# Patient Record
Sex: Female | Born: 1975 | Hispanic: Yes | Marital: Single | Smoking: Never smoker
Health system: Southern US, Community
[De-identification: ages and names within clinical notes are randomized; demographics above are authoritative.]

## PROBLEM LIST (undated history)

## (undated) DIAGNOSIS — N390 Urinary tract infection, site not specified: Secondary | ICD-10-CM

## (undated) DIAGNOSIS — R87619 Unspecified abnormal cytological findings in specimens from cervix uteri: Secondary | ICD-10-CM

## (undated) HISTORY — PX: NO PAST SURGERIES: SHX2092

## (undated) HISTORY — PX: CERVICAL BIOPSY  W/ LOOP ELECTRODE EXCISION: SUR135

## (undated) HISTORY — DX: Unspecified abnormal cytological findings in specimens from cervix uteri: R87.619

---

## 2006-05-17 HISTORY — PX: BREAST ENHANCEMENT SURGERY: SHX7

## 2013-06-23 ENCOUNTER — Emergency Department (HOSPITAL_BASED_OUTPATIENT_CLINIC_OR_DEPARTMENT_OTHER)
Admission: EM | Admit: 2013-06-23 | Discharge: 2013-06-23 | Disposition: A | Discharge status: Home / Self Care | Payer: MEDICAID | Attending: Emergency Medicine | Admitting: Emergency Medicine

## 2013-06-23 ENCOUNTER — Encounter (HOSPITAL_BASED_OUTPATIENT_CLINIC_OR_DEPARTMENT_OTHER): Payer: Self-pay | Admitting: Emergency Medicine

## 2013-06-23 DIAGNOSIS — IMO0002 Reserved for concepts with insufficient information to code with codable children: Secondary | ICD-10-CM | POA: Insufficient documentation

## 2013-06-23 DIAGNOSIS — Z3202 Encounter for pregnancy test, result negative: Secondary | ICD-10-CM | POA: Insufficient documentation

## 2013-06-23 DIAGNOSIS — F10121 Alcohol abuse with intoxication delirium: Secondary | ICD-10-CM

## 2013-06-23 DIAGNOSIS — R1110 Vomiting, unspecified: Secondary | ICD-10-CM | POA: Insufficient documentation

## 2013-06-23 DIAGNOSIS — F1010 Alcohol abuse, uncomplicated: Secondary | ICD-10-CM | POA: Insufficient documentation

## 2013-06-23 LAB — URINALYSIS, ROUTINE W REFLEX MICROSCOPIC
Bilirubin Urine: NEGATIVE
Glucose, UA: NEGATIVE mg/dL
HGB URINE DIPSTICK: NEGATIVE
Ketones, ur: NEGATIVE mg/dL
LEUKOCYTES UA: NEGATIVE
Nitrite: NEGATIVE
PROTEIN: NEGATIVE mg/dL
SPECIFIC GRAVITY, URINE: 1.005 (ref 1.005–1.030)
Urobilinogen, UA: 0.2 mg/dL (ref 0.0–1.0)
pH: 6 (ref 5.0–8.0)

## 2013-06-23 LAB — CBC WITH DIFFERENTIAL/PLATELET
BAND NEUTROPHILS: 0 % (ref 0–10)
BASOS ABS: 0 10*3/uL (ref 0.0–0.1)
BASOS PCT: 0 % (ref 0–1)
BLASTS: 0 %
Eosinophils Absolute: 0.8 10*3/uL — ABNORMAL HIGH (ref 0.0–0.7)
Eosinophils Relative: 6 % — ABNORMAL HIGH (ref 0–5)
HCT: 39.6 % (ref 36.0–46.0)
HEMOGLOBIN: 13.5 g/dL (ref 12.0–15.0)
LYMPHS ABS: 5.9 10*3/uL — AB (ref 0.7–4.0)
LYMPHS PCT: 44 % (ref 12–46)
MCH: 28.1 pg (ref 26.0–34.0)
MCHC: 34.1 g/dL (ref 30.0–36.0)
MCV: 82.5 fL (ref 78.0–100.0)
METAMYELOCYTES PCT: 0 %
MYELOCYTES: 0 %
Monocytes Absolute: 1.3 10*3/uL — ABNORMAL HIGH (ref 0.1–1.0)
Monocytes Relative: 10 % (ref 3–12)
Neutro Abs: 5.4 10*3/uL (ref 1.7–7.7)
Neutrophils Relative %: 40 % — ABNORMAL LOW (ref 43–77)
Platelets: 300 10*3/uL (ref 150–400)
Promyelocytes Absolute: 0 %
RBC: 4.8 MIL/uL (ref 3.87–5.11)
RDW: 12.8 % (ref 11.5–15.5)
WBC: 13.4 10*3/uL — ABNORMAL HIGH (ref 4.0–10.5)
nRBC: 0 /100 WBC

## 2013-06-23 LAB — RAPID URINE DRUG SCREEN, HOSP PERFORMED
Amphetamines: NOT DETECTED
BARBITURATES: NOT DETECTED
Benzodiazepines: NOT DETECTED
COCAINE: NOT DETECTED
Opiates: NOT DETECTED
Tetrahydrocannabinol: NOT DETECTED

## 2013-06-23 LAB — PREGNANCY, URINE: Preg Test, Ur: NEGATIVE

## 2013-06-23 LAB — COMPREHENSIVE METABOLIC PANEL
ALK PHOS: 93 U/L (ref 39–117)
ALT: 16 U/L (ref 0–35)
AST: 18 U/L (ref 0–37)
Albumin: 4.5 g/dL (ref 3.5–5.2)
BUN: 9 mg/dL (ref 6–23)
CO2: 16 mEq/L — ABNORMAL LOW (ref 19–32)
Calcium: 9.5 mg/dL (ref 8.4–10.5)
Chloride: 107 mEq/L (ref 96–112)
Creatinine, Ser: 0.6 mg/dL (ref 0.50–1.10)
GFR calc non Af Amer: >90 mL/min (ref >90)
GLUCOSE: 121 mg/dL — AB (ref 70–99)
POTASSIUM: 3.5 meq/L — AB (ref 3.7–5.3)
SODIUM: 144 meq/L (ref 137–147)
Total Bilirubin: <0.2 mg/dL — ABNORMAL LOW (ref 0.3–1.2)
Total Protein: 8.6 g/dL — ABNORMAL HIGH (ref 6.0–8.3)

## 2013-06-23 LAB — ETHANOL: Alcohol, Ethyl (B): 221 mg/dL — ABNORMAL HIGH (ref 0–11)

## 2013-06-23 MED ORDER — SODIUM CHLORIDE 0.9 % IV BOLUS (SEPSIS)
1000.0000 mL | Freq: Once | INTRAVENOUS | Status: AC
  Administered 2013-06-23: 1000 mL via INTRAVENOUS

## 2013-06-23 MED ORDER — PANTOPRAZOLE SODIUM 40 MG IV SOLR
40.0000 mg | Freq: Once | INTRAVENOUS | Status: AC
  Administered 2013-06-23: 40 mg via INTRAVENOUS

## 2013-06-23 MED ORDER — ONDANSETRON HCL 4 MG/2ML IJ SOLN
4.0000 mg | Freq: Once | INTRAMUSCULAR | Status: AC
  Administered 2013-06-23: 4 mg via INTRAVENOUS

## 2013-06-23 MED ORDER — ONDANSETRON 4 MG PO TBDP
ORAL_TABLET | ORAL | Status: AC
  Filled 2013-06-23: qty 1

## 2013-06-23 MED ORDER — ZIPRASIDONE MESYLATE 20 MG IM SOLR
10.0000 mg | Freq: Once | INTRAMUSCULAR | Status: AC
  Administered 2013-06-23: 10 mg via INTRAMUSCULAR

## 2013-06-23 MED ORDER — ONDANSETRON 4 MG PO TBDP
4.0000 mg | ORAL_TABLET | Freq: Once | ORAL | Status: AC
  Administered 2013-06-23: 4 mg via ORAL

## 2013-06-23 NOTE — ED Notes (Signed)
Pt states she had benadryl with wine tonight. Pt states her [place] was [place] at a club by a candle tonight.

## 2013-06-23 NOTE — ED Notes (Signed)
Pt brought in by friend , ETOH , pt flopping around on stretcher, vomiting and moaning

## 2013-06-23 NOTE — ED Provider Notes (Signed)
CSN: [id number]     Arrival date & time 06/23/13  0108 History   None    Chief Complaint  Patient presents with  . Alcohol Intoxication   (Consider location/radiation/quality/duration/timing/severity/associated sxs/prior Treatment) HPI Level 5 Caveat: altered LOC. This is a 38 year old female was brought in by a friend. She reportedly was drinking alcohol earlier and on arrival is very agitated, and coherent, vomiting, moaning and flailing her limbs. The friend states she only had "a couple of glasses of wine". She is not known to have taken any other drugs other than an allergy medication.  History reviewed. No pertinent past medical history. History reviewed. No pertinent past surgical history. History reviewed. No pertinent family history. History  Substance Use Topics  . Smoking status: Never Smoker   . Smokeless tobacco: Not on file  . Alcohol Use: No   OB History   Grav Para Term Preterm Abortions TAB SAB Ect Mult Living                 Review of Systems  Unable to perform ROS   Allergies  Review of patient's allergies indicates no known allergies.  Home Medications  No current outpatient prescriptions on file. BP 105/63  Pulse 86  Temp(Src) 98.1 F (36.7 C) (Oral)  Resp 12  Ht 5\' 5"  (1.651 m)  Wt 140 lb (63.504 kg)  BMI 23.30 kg/m2  SpO2 98%  LMP 06/13/2013  Physical Exam General: Well-developed, well-nourished female; appearance consistent with age of record HENT: normocephalic; atraumatic Eyes: pupils equal, round and reactive to light; extraocular muscles intact Neck: supple Heart: regular rate and rhythm; no murmurs, rubs or gallops Lungs: clear to auscultation bilaterally Abdomen: soft; nondistended; nontender; no masses or hepatosplenomegaly; bowel sounds present Extremities: No deformity; full range of motion; pulses normal Neurologic: Agitated, altered, intermittent response to verbal commands, able to answer some questions during calm periods,  otherwise moaning and thrashing about; motor function intact in all extremities and symmetric; no facial droop Skin: Warm and dry    ED Course  Procedures (including critical care time)  MDM   Nursing notes and vitals signs, including pulse oximetry, reviewed.  Summary of this visit's results, reviewed by myself:  Labs:  Results for orders placed during the hospital encounter of 06/23/13 (from the past 24 hour(s))  ETHANOL     Status: Abnormal   Collection Time    06/23/13  1:10 AM      Result Value Range   Alcohol, Ethyl (B) 221 (*) 0 - 11 mg/dL  COMPREHENSIVE METABOLIC PANEL     Status: Abnormal   Collection Time    06/23/13  1:10 AM      Result Value Range   Sodium 144  137 - 147 mEq/L   Potassium 3.5 (*) 3.7 - 5.3 mEq/L   Chloride 107  96 - 112 mEq/L   CO2 16 (*) 19 - 32 mEq/L   Glucose, Bld 121 (*) 70 - 99 mg/dL   BUN 9  6 - 23 mg/dL   Creatinine, Ser 7.82  0.50 - 1.10 mg/dL   Calcium 9.5  8.4 - 95.6 mg/dL   Total Protein 8.6 (*) 6.0 - 8.3 g/dL   Albumin 4.5  3.5 - 5.2 g/dL   AST 18  0 - 37 U/L   ALT 16  0 - 35 U/L   Alkaline Phosphatase 93  39 - 117 U/L   Total Bilirubin <0.2 (*) 0.3 - 1.2 mg/dL   GFR calc non Af  Amer >90  >90 mL/min   GFR calc Af Amer >90  >90 mL/min  CBC WITH DIFFERENTIAL     Status: Abnormal   Collection Time    06/23/13  1:10 AM      Result Value Range   WBC 13.4 (*) 4.0 - 10.5 K/uL   RBC 4.80  3.87 - 5.11 MIL/uL   Hemoglobin 13.5  12.0 - 15.0 g/dL   HCT 96.039.6  45.436.0 - 09.846.0 %   MCV 82.5  78.0 - 100.0 fL   MCH 28.1  26.0 - 34.0 pg   MCHC 34.1  30.0 - 36.0 g/dL   RDW 11.912.8  14.711.5 - 82.915.5 %   Platelets 300  150 - 400 K/uL   Neutrophils Relative % 40 (*) 43 - 77 %   Lymphocytes Relative 44  12 - 46 %   Monocytes Relative 10  3 - 12 %   Eosinophils Relative 6 (*) 0 - 5 %   Basophils Relative 0  0 - 1 %   Band Neutrophils 0  0 - 10 %   Metamyelocytes Relative 0     Myelocytes 0     Promyelocytes Absolute 0     Blasts 0     nRBC 0  0 /100  WBC   Neutro Abs 5.4  1.7 - 7.7 K/uL   Lymphs Abs 5.9 (*) 0.7 - 4.0 K/uL   Monocytes Absolute 1.3 (*) 0.1 - 1.0 K/uL   Eosinophils Absolute 0.8 (*) 0.0 - 0.7 K/uL   Basophils Absolute 0.0  0.0 - 0.1 K/uL   Smear Review MORPHOLOGY UNREMARKABLE    URINE RAPID DRUG SCREEN (HOSP PERFORMED)     Status: None   Collection Time    06/23/13  1:20 AM      Result Value Range   Opiates NONE DETECTED  NONE DETECTED   Cocaine NONE DETECTED  NONE DETECTED   Benzodiazepines NONE DETECTED  NONE DETECTED   Amphetamines NONE DETECTED  NONE DETECTED   Tetrahydrocannabinol NONE DETECTED  NONE DETECTED   Barbiturates NONE DETECTED  NONE DETECTED  PREGNANCY, URINE     Status: None   Collection Time    06/23/13  1:20 AM      Result Value Range   Preg Test, Ur NEGATIVE  NEGATIVE  URINALYSIS, ROUTINE W REFLEX MICROSCOPIC     Status: Abnormal   Collection Time    06/23/13  1:20 AM      Result Value Range   Color, Urine COLORLESS (*) YELLOW   APPearance CLEAR  CLEAR   Specific Gravity, Urine 1.005  1.005 - 1.030   pH 6.0  5.0 - 8.0   Glucose, UA NEGATIVE  NEGATIVE mg/dL   Hgb urine dipstick NEGATIVE  NEGATIVE   Bilirubin Urine NEGATIVE  NEGATIVE   Ketones, ur NEGATIVE  NEGATIVE mg/dL   Protein, ur NEGATIVE  NEGATIVE mg/dL   Urobilinogen, UA 0.2  0.0 - 1.0 mg/dL   Nitrite NEGATIVE  NEGATIVE   Leukocytes, UA NEGATIVE  NEGATIVE   1:57 AM Patient resting more comfortably after Geodon 10 mg IM.  3:41 AM Patient now awake and able to ambulate. She will be discharged in the company of her friend who appears to be a sober, competent adult     Hanley SeamenJohn L Acelyn Basham, MD 06/23/13 [phone]

## 2013-06-23 NOTE — ED Notes (Signed)
Pt states she was at [employer] - presents with her "friend" [place] -

## 2013-06-23 NOTE — Discharge Instructions (Signed)
Alcohol Intoxication °Alcohol intoxication occurs when the amount of alcohol that a person has consumed impairs his or her ability to mentally and physically function. Alcohol directly impairs the normal chemical activity of the brain. Drinking large amounts of alcohol can lead to changes in mental function and behavior, and it can cause many physical effects that can be harmful.  °Alcohol intoxication can range in severity from mild to very severe. Various factors can affect the level of intoxication that occurs, such as the person's age, gender, weight, frequency of alcohol consumption, and the presence of other medical conditions (such as diabetes, seizures, or heart conditions). Dangerous levels of alcohol intoxication may occur when people drink large amounts of alcohol in a short period (binge drinking). Alcohol can also be especially dangerous when combined with certain prescription medicines or "recreational" drugs. °SIGNS AND SYMPTOMS °Some common signs and symptoms of mild alcohol intoxication include: °· Loss of coordination. °· Changes in mood and behavior. °· Impaired judgment. °· Slurred speech. °As alcohol intoxication progresses to more severe levels, other signs and symptoms will appear. These may include: °· Vomiting. °· Confusion and impaired memory. °· Slowed breathing. °· Seizures. °· Loss of consciousness. °DIAGNOSIS  °Your health care provider will take a medical history and perform a physical exam. You will be asked about the amount and type of alcohol you have consumed. Blood tests will be done to measure the concentration of alcohol in your blood. In many places, your blood alcohol level must be lower than 80 mg/dL (0.08%) to legally drive. However, many dangerous effects of alcohol can occur at much lower levels.  °TREATMENT  °People with alcohol intoxication often do not require treatment. Most of the effects of alcohol intoxication are temporary, and they go away as the alcohol naturally  leaves the body. Your health care provider will monitor your condition until you are stable enough to go home. Fluids are sometimes given through an IV access tube to help prevent dehydration.  °HOME CARE INSTRUCTIONS °· Do not drive after drinking alcohol. °· Stay hydrated. Drink enough water and fluids to keep your urine clear or pale yellow. Avoid caffeine.   °· Only take over-the-counter or prescription medicines as directed by your health care provider.   °SEEK MEDICAL CARE IF:  °· You have persistent vomiting.   °· You do not feel better after a few days. °· You have frequent alcohol intoxication. Your health care provider can help determine if you should see a substance use treatment counselor. °SEEK IMMEDIATE MEDICAL CARE IF:  °· You become shaky or tremble when you try to stop drinking.   °· You shake uncontrollably (seizure).   °· You throw up (vomit) blood. This may be bright red or may look like black coffee grounds.   °· You have blood in your stool. This may be bright red or may appear as a black, tarry, bad smelling stool.   °· You become lightheaded or faint.   °MAKE SURE YOU:  °· Understand these instructions. °· Will watch your condition. °· Will get help right away if you are not doing well or get worse. °Document Released: 02/10/2005 Document Revised: 01/03/2013 Document Reviewed: 10/06/2012 °[employer]® Patient Information ©2014 [employer]. ° °

## 2015-11-15 DIAGNOSIS — N390 Urinary tract infection, site not specified: Secondary | ICD-10-CM

## 2015-11-15 HISTORY — DX: Urinary tract infection, site not specified: N39.0

## 2015-11-19 ENCOUNTER — Encounter: Payer: Self-pay | Admitting: *Deleted

## 2015-11-19 ENCOUNTER — Other Ambulatory Visit: Payer: Medicaid Other

## 2015-11-19 NOTE — Patient Instructions (Signed)
  Your procedure is scheduled on: 11-25-15  Report to [facility] Day Surgery 2nd floor medical mall To find out your arrival time please call [phone] between 1PM - 3PM on 11-24-15  Remember: Instructions that are not followed completely may result in serious medical risk, up to and including death, or upon the discretion of your surgeon and anesthesiologist your surgery may need to be rescheduled.    _x___ 1. Do not eat food or drink liquids after midnight. No gum chewing or hard candies.     __x__ 2. No Alcohol for 24 hours before or after surgery.   __x__3. No Smoking for 24 prior to surgery.   ____  4. Bring all medications with you on the day of surgery if instructed.    __x__ 5. Notify your doctor if there is any change in your medical condition     (cold, fever, infections).     Do not wear jewelry, make-up, hairpins, clips or nail polish.  Do not wear lotions, powders, or perfumes. You may wear deodorant.  Do not shave 48 hours prior to surgery. Men may shave face and neck.  Do not bring valuables to the hospital.    [facility] is not responsible for any belongings or valuables.               Contacts, dentures or bridgework may not be worn into surgery.  Leave your suitcase in the car. After surgery it may be brought to your room.  For patients admitted to the hospital, discharge time is determined by your treatment team.   Patients discharged the day of surgery will not be allowed to drive home.    Please read over the following fact sheets that you were given:   [facility] and or MRSA Information   ____ Take these medicines the morning of surgery with A SIP OF WATER:    1. NONE  2.  3.  4.  5.  6.  ____ Fleet Enema (as directed)   ____ Use CHG Soap or sage wipes as directed on instruction sheet   ____ Use inhalers on the day of surgery and bring to hospital day of surgery  ____ Stop metformin 2 days prior to surgery    ____ Take 1/2 of  usual insulin dose the night before surgery and none on the morning of surgery.   ____ Stop aspirin or coumadin, or plavix  _x__ Stop Anti-inflammatories such as Advil, Aleve, Ibuprofen, Motrin, Naproxen,          Naprosyn, Goodies powders or aspirin products. Ok to take Tylenol.   ____ Stop supplements until after surgery.    ____ Bring C-Pap to the hospital.  

## 2015-11-24 ENCOUNTER — Encounter: Payer: Self-pay | Admitting: *Deleted

## 2015-11-25 ENCOUNTER — Encounter: Payer: Self-pay | Admitting: *Deleted

## 2015-11-25 ENCOUNTER — Ambulatory Visit: Payer: Medicaid Other | Admitting: Anesthesiology

## 2015-11-25 ENCOUNTER — Encounter
Admission: RE | Disposition: A | Discharge status: Home / Self Care | Payer: Self-pay | Source: Ambulatory Visit | Attending: Obstetrics and Gynecology

## 2015-11-25 ENCOUNTER — Ambulatory Visit
Admission: RE | Admit: 2015-11-25 | Discharge: 2015-11-25 | Disposition: A | Discharge status: Home / Self Care | Payer: Medicaid Other | Source: Ambulatory Visit | Attending: Obstetrics and Gynecology | Admitting: Obstetrics and Gynecology

## 2015-11-25 DIAGNOSIS — D069 Carcinoma in situ of cervix, unspecified: Secondary | ICD-10-CM | POA: Diagnosis present

## 2015-11-25 DIAGNOSIS — N72 Inflammatory disease of cervix uteri: Secondary | ICD-10-CM | POA: Insufficient documentation

## 2015-11-25 HISTORY — DX: Urinary tract infection, site not specified: N39.0

## 2015-11-25 HISTORY — PX: CERVICAL CONIZATION W/BX: SHX1330

## 2015-11-25 LAB — COMPREHENSIVE METABOLIC PANEL
ALBUMIN: 4.5 g/dL (ref 3.5–5.0)
ALK PHOS: 90 U/L (ref 38–126)
ALT: 29 U/L (ref 14–54)
AST: 23 U/L (ref 15–41)
Anion gap: 7 (ref 5–15)
BILIRUBIN TOTAL: 0.4 mg/dL (ref 0.3–1.2)
BUN: 9 mg/dL (ref 6–20)
CALCIUM: 9.2 mg/dL (ref 8.9–10.3)
CO2: 25 mmol/L (ref 22–32)
Chloride: 107 mmol/L (ref 101–111)
Creatinine, Ser: 0.63 mg/dL (ref 0.44–1.00)
GFR calc Af Amer: >60 mL/min (ref >60)
GFR calc non Af Amer: >60 mL/min (ref >60)
GLUCOSE: 92 mg/dL (ref 65–99)
Potassium: 3.6 mmol/L (ref 3.5–5.1)
Sodium: 139 mmol/L (ref 135–145)
TOTAL PROTEIN: 7.8 g/dL (ref 6.5–8.1)

## 2015-11-25 LAB — POCT PREGNANCY, URINE: PREG TEST UR: NEGATIVE

## 2015-11-25 LAB — CBC
HEMATOCRIT: 40 % (ref 35.0–47.0)
HEMOGLOBIN: 13.7 g/dL (ref 12.0–16.0)
MCH: 27.9 pg (ref 26.0–34.0)
MCHC: 34.3 g/dL (ref 32.0–36.0)
MCV: 81.3 fL (ref 80.0–100.0)
Platelets: 266 10*3/uL (ref 150–440)
RBC: 4.93 MIL/uL (ref 3.80–5.20)
RDW: 12.7 % (ref 11.5–14.5)
WBC: 6.3 10*3/uL (ref 3.6–11.0)

## 2015-11-25 LAB — TYPE AND SCREEN
ABO/RH(D): O POS
Antibody Screen: NEGATIVE

## 2015-11-25 SURGERY — CONE BIOPSY, CERVIX
Anesthesia: General | Wound class: Clean Contaminated

## 2015-11-25 MED ORDER — FERRIC SUBSULFATE 259 MG/GM EX SOLN
CUTANEOUS | Status: AC
  Filled 2015-11-25: qty 8

## 2015-11-25 MED ORDER — EPHEDRINE SULFATE 50 MG/ML IJ SOLN
INTRAMUSCULAR | Status: DC | PRN
  Administered 2015-11-25: 5 mg via INTRAVENOUS

## 2015-11-25 MED ORDER — DEXAMETHASONE SODIUM PHOSPHATE 10 MG/ML IJ SOLN
INTRAMUSCULAR | Status: DC | PRN
  Administered 2015-11-25: 5 mg via INTRAVENOUS

## 2015-11-25 MED ORDER — FENTANYL CITRATE (PF) 100 MCG/2ML IJ SOLN
INTRAMUSCULAR | Status: DC | PRN
Start: 2015-11-25 — End: 2015-11-25
  Administered 2015-11-25 (×5): 25 ug via INTRAVENOUS

## 2015-11-25 MED ORDER — FAMOTIDINE 20 MG PO TABS
ORAL_TABLET | ORAL | Status: AC
  Administered 2015-11-25: 20 mg via ORAL
  Filled 2015-11-25: qty 1

## 2015-11-25 MED ORDER — IBUPROFEN 600 MG PO TABS: 600.0000 mg | ORAL_TABLET | Freq: Four times a day (QID) | ORAL | Status: DC | PRN

## 2015-11-25 MED ORDER — IODINE STRONG (LUGOL'S) SOLN
Status: DC | PRN
  Administered 2015-11-25: 1 mg via TOPICAL

## 2015-11-25 MED ORDER — FAMOTIDINE 20 MG PO TABS
20.0000 mg | ORAL_TABLET | Freq: Once | ORAL | Status: AC
  Administered 2015-11-25: 20 mg via ORAL

## 2015-11-25 MED ORDER — LACTATED RINGERS IV SOLN: INTRAVENOUS | Status: DC

## 2015-11-25 MED ORDER — ONDANSETRON HCL 4 MG/2ML IJ SOLN
INTRAMUSCULAR | Status: DC | PRN
  Administered 2015-11-25: 4 mg via INTRAVENOUS

## 2015-11-25 MED ORDER — LIDOCAINE HCL (CARDIAC) 20 MG/ML IV SOLN
INTRAVENOUS | Status: DC | PRN
  Administered 2015-11-25: 45 mg via INTRAVENOUS

## 2015-11-25 MED ORDER — LACTATED RINGERS IV SOLN
INTRAVENOUS | Status: DC
  Administered 2015-11-25: 125 mL/h via INTRAVENOUS
  Administered 2015-11-25: 17:00:00 via INTRAVENOUS

## 2015-11-25 MED ORDER — IODINE STRONG (LUGOLS) 5 % PO SOLN
ORAL | Status: AC
  Filled 2015-11-25: qty 1

## 2015-11-25 MED ORDER — ONDANSETRON HCL 4 MG/2ML IJ SOLN: 4.0000 mg | Freq: Once | INTRAMUSCULAR | Status: DC | PRN

## 2015-11-25 MED ORDER — MIDAZOLAM HCL 5 MG/5ML IJ SOLN
INTRAMUSCULAR | Status: DC | PRN
  Administered 2015-11-25: 2 mg via INTRAVENOUS

## 2015-11-25 MED ORDER — LIDOCAINE-EPINEPHRINE 1 %-1:100000 IJ SOLN
INTRAMUSCULAR | Status: DC | PRN
  Administered 2015-11-25: 10 mL

## 2015-11-25 MED ORDER — HYDROCODONE-ACETAMINOPHEN 5-325 MG PO TABS: 1.0000 | ORAL_TABLET | Freq: Four times a day (QID) | ORAL | Status: DC | PRN

## 2015-11-25 MED ORDER — LIDOCAINE-EPINEPHRINE (PF) 1 %-1:200000 IJ SOLN
INTRAMUSCULAR | Status: AC
  Filled 2015-11-25: qty 30

## 2015-11-25 MED ORDER — FERRIC SUBSULFATE 259 MG/GM EX SOLN
CUTANEOUS | Status: DC | PRN
  Administered 2015-11-25: 1

## 2015-11-25 MED ORDER — FENTANYL CITRATE (PF) 100 MCG/2ML IJ SOLN: 25.0000 ug | INTRAMUSCULAR | Status: DC | PRN

## 2015-11-25 MED ORDER — PROPOFOL 10 MG/ML IV BOLUS
INTRAVENOUS | Status: DC | PRN
  Administered 2015-11-25: 150 mg via INTRAVENOUS

## 2015-11-25 SURGICAL SUPPLY — 29 items
APPLICATOR COTTON TIP 6IN STRL (MISCELLANEOUS) ×6 IMPLANT
BLADE SURG SZ11 CARB STEEL (BLADE) ×3 IMPLANT
CATH ROBINSON RED A/P 16FR (CATHETERS) IMPLANT
CNTNR SPEC 2.5X3XGRAD LEK (MISCELLANEOUS) ×1
CONT SPEC 4OZ STER OR WHT (MISCELLANEOUS) ×2
CONTAINER SPEC 2.5X3XGRAD LEK (MISCELLANEOUS) ×1 IMPLANT
COUNTER NEEDLE 20/40 LG (NEEDLE) ×3 IMPLANT
ELECT REM PT RETURN 9FT ADLT (ELECTROSURGICAL) ×3
ELECTRODE REM PT RTRN 9FT ADLT (ELECTROSURGICAL) ×1 IMPLANT
GLOVE BIO SURGEON STRL SZ8 (GLOVE) ×9 IMPLANT
GLOVE INDICATOR 7.0 STRL GRN (GLOVE) ×6 IMPLANT
GOWN STRL REUS W/ TWL LRG LVL3 (GOWN DISPOSABLE) ×2 IMPLANT
GOWN STRL REUS W/ TWL XL LVL3 (GOWN DISPOSABLE) ×1 IMPLANT
GOWN STRL REUS W/TWL LRG LVL3 (GOWN DISPOSABLE) ×4
GOWN STRL REUS W/TWL XL LVL3 (GOWN DISPOSABLE) ×2
HANDLE YANKAUER SUCT BULB TIP (MISCELLANEOUS) ×3 IMPLANT
KIT RM TURNOVER CYSTO AR (KITS) ×3 IMPLANT
NEEDLE SPNL 22GX3.5 QUINCKE BK (NEEDLE) ×3 IMPLANT
NS IRRIG 500ML POUR BTL (IV SOLUTION) ×3 IMPLANT
PACK BASIN MINOR ARMC (MISCELLANEOUS) IMPLANT
PACK DNC HYST (MISCELLANEOUS) ×3 IMPLANT
PAD OB MATERNITY 4.3X12.25 (PERSONAL CARE ITEMS) ×3 IMPLANT
PAD PREP 24X41 OB/GYN DISP (PERSONAL CARE ITEMS) ×3 IMPLANT
PENCIL ELECTRO HAND CTR (MISCELLANEOUS) ×3 IMPLANT
SUT CHROMIC 1-0 (SUTURE) IMPLANT
SUT VIC AB 0 CT1 27 (SUTURE) ×2
SUT VIC AB 0 CT1 27XCR 8 STRN (SUTURE) ×1 IMPLANT
SYR CONTROL 10ML (SYRINGE) ×3 IMPLANT
TRAY PREP VAG/GEN (MISCELLANEOUS) IMPLANT

## 2015-11-25 NOTE — Anesthesia Postprocedure Evaluation (Signed)
Anesthesia Post Note  Patient: Hayley Obrien  Procedure(s) Performed: Procedure(s) (LRB): CONIZATION CERVIX WITH BIOPSY (N/A)  Patient location during evaluation: PACU Anesthesia Type: General Level of consciousness: awake and alert Pain management: pain level controlled Vital Signs Assessment: post-procedure vital signs reviewed and stable Respiratory status: spontaneous breathing, nonlabored ventilation, respiratory function stable and patient connected to nasal cannula oxygen Cardiovascular status: blood pressure returned to baseline and stable Postop Assessment: no signs of nausea or vomiting Anesthetic complications: no    Last Vitals:  Filed Vitals:   11/25/15 1725 11/25/15 1800  BP: 90/57 114/72  Pulse: 61 74  Temp: 36.6 C   Resp: 16 16    Last Pain:  Filed Vitals:   11/25/15 1812  PainSc: 2                  Laniya Friedl S

## 2015-11-25 NOTE — Op Note (Signed)
  Operative Note    Pre-Op Diagnosis: Persistent CIN III  Post-Op Diagnosis: Persistent CIN III  Procedures:  Cold Knife Conization of the Cervix  Primary Surgeon: Thomasene MohairStephen Laquisha Northcraft, MD   EBL: 40 mL   IVF: 800 mL   Specimens: Cervical Cone Biopsy with stitch at 12 o'clock, ECC  Drains: None  Complications: None   Disposition: PACU   Condition: Stable   Procedure Summary:  The patient was taken to the OR where general anesthesia was administered and found to be adequate. She was placed in the dorsal supine, high lithotomy position in candy cane stirrups.  She was prepped and draped in the usual sterile fashion.  After a timeout was called a weighted speculum was placed in the vagina with an anterior right-angle retractor placed.  Stay sutures of 0 vicryl were placed at 3 and 9 o'clock on the cervix.  A paracervical block was placed circumferentially around the cervix using lidocaine 1% with epinephrine.  Lugol's solution was used to assess dysplasia with no Lugol's negative areas noted.  An 11 blade was used to cut the cone specimen at the ectocervical and endocervical margins with monopolar electrocautery used along the cervical stroma.  The specimen was tagged with a suture at 12 o'clock. An ECC was then performed.  Bovie cautery was used to ensure hemostasis at the surgical bed. Sutures were placed along the ectocervical epithelium in a running fashion.  The cervical os was opened and Monsel's solution was placed on the bed. The stay sutures were removed and hemostasis was noted.  The patient tolerated the procedure well.  Sponge, lap, needle, and instrument counts were correct x 2.  VTE prophylaxis: SCDs. Antibiotic prophylaxis: none indicated. She was awakened in the operating room and was taken to the PACU in stable condition.   Thomasene MohairStephen Kelly Ranieri, MD 11/25/2015 4:28 PM

## 2015-11-25 NOTE — Anesthesia Procedure Notes (Signed)
Procedure Name: LMA Insertion Date/Time: 11/25/2015 3:35 PM Performed by: Lily KocherPERALTA, Janely Gullickson Pre-anesthesia Checklist: Patient identified, Patient being monitored, Timeout performed, Emergency Drugs available and Suction available Patient Re-evaluated:Patient Re-evaluated prior to inductionOxygen Delivery Method: Circle system utilized Preoxygenation: Pre-oxygenation with 100% oxygen Intubation Type: IV induction Ventilation: Mask ventilation without difficulty LMA: LMA inserted LMA Size: 4.0 Tube type: Oral Number of attempts: 1 Placement Confirmation: positive ETCO2 and breath sounds checked- equal and bilateral Tube secured with: Tape Dental Injury: Teeth and Oropharynx as per pre-operative assessment

## 2015-11-25 NOTE — H&P (Signed)
History and Physical Interval Note:  Hayley Obrien  has presented today for surgery, with the diagnosis of CIN 3 WITH POSITIVE MARGIN (endocervical). The various methods of treatment have been discussed with the patient and family. After consideration of risks, benefits and other options for treatment, the patient has consented to  Procedure(s): CONIZATION CERVIX WITH BIOPSY (N/A) as a surgical intervention .  The patient's history has been reviewed, patient examined, no change in status, stable for surgery.  I have reviewed the patient's chart and labs.  Questions were answered to the patient's satisfaction.    Conard NovakJackson, Falicia Lizotte D, MD 11/25/2015 3:23 PM

## 2015-11-25 NOTE — Anesthesia Preprocedure Evaluation (Signed)
Anesthesia Evaluation  Patient identified by MRN, date of birth, ID band Patient awake    Reviewed: Allergy & Precautions, NPO status , Patient's Chart, lab work & pertinent test results  Airway Mallampati: II       Dental no notable dental hx.    Pulmonary neg pulmonary ROS,    Pulmonary exam normal        Cardiovascular negative cardio ROS Normal cardiovascular exam     Neuro/Psych negative neurological ROS  negative psych ROS   GI/Hepatic negative GI ROS, Neg liver ROS,   Endo/Other  negative endocrine ROS  Renal/GU negative Renal ROS  Female GU complaint     Musculoskeletal negative musculoskeletal ROS (+)   Abdominal Normal abdominal exam  (+)   Peds negative pediatric ROS (+)  Hematology negative hematology ROS (+)   Anesthesia Other Findings   Reproductive/Obstetrics                             Anesthesia Physical Anesthesia Plan  ASA: I  Anesthesia Plan: General   Post-op Pain Management:    Induction: Intravenous  Airway Management Planned: LMA  Additional Equipment:   Intra-op Plan:   Post-operative Plan: Extubation in OR  Informed Consent: I have reviewed the patients History and Physical, chart, labs and discussed the procedure including the risks, benefits and alternatives for the proposed anesthesia with the patient or authorized representative who has indicated his/her understanding and acceptance.   Dental advisory given  Plan Discussed with: CRNA and Surgeon  Anesthesia Plan Comments:         Anesthesia Quick Evaluation

## 2015-11-25 NOTE — Discharge Instructions (Signed)

## 2015-11-25 NOTE — Transfer of Care (Signed)
Immediate Anesthesia Transfer of Care Note  Patient: Hayley Obrien  Procedure(s) Performed: Procedure(s): CONIZATION CERVIX WITH BIOPSY (N/A)  Patient Location: PACU  Anesthesia Type:General  Level of Consciousness: sedated  Airway & Oxygen Therapy: Patient Spontanous Breathing and Patient connected to face mask oxygen  Post-op Assessment: Report given to RN  Post vital signs: Reviewed and stable  Last Vitals:  Filed Vitals:   11/25/15 1252 11/25/15 1638  BP: 119/74 92/56  Pulse: 79 68  Temp: 36.7 C 36.2 C  Resp: 16 14    Last Pain: There were no vitals filed for this visit.    Patients Stated Pain Goal: 0 (11/25/15 1252)  Complications: No apparent anesthesia complications

## 2015-11-26 ENCOUNTER — Encounter: Payer: Self-pay | Admitting: Obstetrics and Gynecology

## 2015-11-28 ENCOUNTER — Encounter: Payer: Self-pay | Admitting: Obstetrics and Gynecology

## 2015-11-28 MED FILL — Iodine Solution Strong 5% (Lugol's): ORAL | Qty: 0.2 | Status: AC

## 2015-12-01 LAB — SURGICAL PATHOLOGY

## 2016-01-22 ENCOUNTER — Other Ambulatory Visit: Payer: Self-pay | Admitting: Obstetrics and Gynecology

## 2017-01-10 ENCOUNTER — Encounter: Payer: Self-pay | Admitting: Obstetrics and Gynecology

## 2017-01-10 ENCOUNTER — Ambulatory Visit (INDEPENDENT_AMBULATORY_CARE_PROVIDER_SITE_OTHER): Payer: Medicaid Other | Admitting: Obstetrics and Gynecology

## 2017-01-10 VITALS — BP 122/70 | Ht 62.0 in | Wt 150.0 lb

## 2017-01-10 DIAGNOSIS — N920 Excessive and frequent menstruation with regular cycle: Secondary | ICD-10-CM | POA: Diagnosis not present

## 2017-01-10 DIAGNOSIS — Z0000 Encounter for general adult medical examination without abnormal findings: Secondary | ICD-10-CM | POA: Diagnosis not present

## 2017-01-10 DIAGNOSIS — Z01419 Encounter for gynecological examination (general) (routine) without abnormal findings: Secondary | ICD-10-CM

## 2017-01-10 DIAGNOSIS — Z1331 Encounter for screening for depression: Secondary | ICD-10-CM

## 2017-01-10 DIAGNOSIS — Z1339 Encounter for screening examination for other mental health and behavioral disorders: Secondary | ICD-10-CM

## 2017-01-10 DIAGNOSIS — N946 Dysmenorrhea, unspecified: Secondary | ICD-10-CM | POA: Diagnosis not present

## 2017-01-10 DIAGNOSIS — R350 Frequency of micturition: Secondary | ICD-10-CM

## 2017-01-10 DIAGNOSIS — D069 Carcinoma in situ of cervix, unspecified: Secondary | ICD-10-CM

## 2017-01-10 MED ORDER — NORGESTIMATE-ETH ESTRADIOL 0.25-35 MG-MCG PO TABS: 1.0000 | ORAL_TABLET | Freq: Every day | ORAL | 11 refills | Status: DC

## 2017-01-10 NOTE — Progress Notes (Signed)
Gynecology Annual Exam  PCP: Patient, No Pcp Per  Chief Complaint  Patient presents with  . Annual Exam    History of Present Illness:  [name] is a 41 y.o. G2P2002 who LMP was Patient's last menstrual period was 12/16/2016., presents today for her annual examination.  Her menses are regular every 28-30 days, lasting 3 day(s).  Dysmenorrhea moderate, occurring throughout cycle. She does not have intermenstrual bleeding.  She is single partner, contraception - OCP (estrogen/progesterone).  Last Pap: March 2017, ASC-US, HPV +, Colpo with CIN III, s/p LEEP with +margins, s/p CKC with negative margins. Hx of STDs: HPV  Last mammogram: n/a There is no FH of breast cancer. There is no FH of ovarian cancer. The patient does not do self-breast exams.  Tobacco use: The patient denies current or previous tobacco use. Alcohol use: social drinker Exercise: moderately active  The patient wears seatbelts: yes.      She had an improvement in her abnormal bleeding since her last visit that lasted for about 3 months. She has continued to take the medication, but for the past three months she has still had pain with her menses (and sometimes while she is not menstruating).  Her menstrual blood is also very dark in color.  She has mild dyspareunia.  More lately she has had urinary frequency with sometimes only a small amount of urine that comes out.  She has had some chills.  This has been going on for about 1 month.  She states that a little over a month ago she was testing for a UTI, but it was negative.  Nothing makes it better or worse. Denies other associated symptoms.   Past Medical History:  Diagnosis Date  . Abnormal Pap smear of cervix   . UTI (lower urinary tract infection) 11-2015   resolved per pt and is to f/u with dr Jean Rosenthal prior to upcoming surgery    Past Surgical History:  Procedure Laterality Date  . BREAST ENHANCEMENT SURGERY  2008  . CERVICAL BIOPSY  W/ LOOP ELECTRODE  EXCISION    . CERVICAL CONIZATION W/BX N/A 11/25/2015   Procedure: CONIZATION CERVIX WITH BIOPSY;  Surgeon: Conard Novak, MD;  Location: [facility] OR[facility];  Service: Gynecology;  Laterality: N/A;  . NO PAST SURGERIES      Prior to Admission medications   Medication Sig Start Date End Date Taking? Authorizing Provider  norgestimate-ethinyl estradiol (ORTHO-CYCLEN,SPRINTEC,PREVIFEM) 0.25-35 MG-MCG tablet Take 1 tablet by mouth daily.   Yes [provider]   Allergies: No Known Allergies  Gynecologic History: Patient's last menstrual period was 12/16/2016.  Obstetric History: [id number]  Social History   Social History  . Marital status: Single    Spouse name: N/A  . Number of children: N/A  . Years of education: N/A   Occupational History  . Not on file.   Social History Main Topics  . Smoking status: Never Smoker  . Smokeless tobacco: Never Used  . Alcohol use Yes     Comment: OCC  . Drug use: No  . Sexual activity: Yes    Birth control/ protection: Pill   Other Topics Concern  . Not on file   Social History Narrative  . No narrative on file    Family History  Problem Relation Age of Onset  . Colon cancer Mother   . Uterine cancer Sister     Review of Systems  Constitutional: Positive for chills. Negative for diaphoresis, fever and malaise/fatigue.  HENT: Negative.  Eyes: Negative.   Respiratory: Negative.   Cardiovascular: Negative.   Gastrointestinal: Positive for abdominal pain (see HPI, bilateral lower quadrants). Negative for blood in stool, constipation, diarrhea, heartburn, melena, nausea and vomiting.  Genitourinary: Positive for frequency and urgency. Negative for dysuria, flank pain and hematuria.  Musculoskeletal: Negative.   Skin: Negative.   Neurological: Negative.  Negative for weakness.  Psychiatric/Behavioral: Negative.     Physical Exam BP 122/70   Ht 5\' 2"  (1.575 m)   Wt 150 lb (68 kg)   LMP 12/16/2016   BMI 27.44 kg/m     Physical Exam  Constitutional: She is oriented to person, place, and time. She appears well-developed and well-nourished. No distress.  Genitourinary: Vagina normal and uterus normal. Pelvic exam was performed with patient supine. There is no rash, tenderness or lesion on the right labia. There is no rash, tenderness or lesion on the left labia. Vagina exhibits no lesion. No erythema or bleeding in the vagina. No foreign body in the vagina. No signs of injury around the vagina. No vaginal discharge found.  Right adnexum displays tenderness. Right adnexum does not display mass and does not display fullness.  Left adnexum displays tenderness. Left adnexum does not display mass and does not display fullness. Cervix does not exhibit motion tenderness, lesion, discharge or polyp.   Uterus is mobile and anteverted. Uterus is not enlarged, tender, exhibiting a mass or irregular (is regular).  HENT:  Head: Normocephalic and atraumatic.  Eyes: EOM are normal. No scleral icterus.  Neck: Normal range of motion. Neck supple. No thyromegaly present.  Cardiovascular: Normal rate and regular rhythm.  Exam reveals no gallop and no friction rub.   No murmur heard. Pulmonary/Chest: Effort normal and breath sounds normal. No respiratory distress. She has no wheezes. She has no rales. Right breast exhibits no inverted nipple, no mass, no nipple discharge and no skin change. Left breast exhibits no inverted nipple, no mass, no nipple discharge and no skin change.  Implants limit breast exam  Abdominal: Soft. Bowel sounds are normal. She exhibits no distension and no mass. There is no tenderness. There is no rebound and no guarding.  Musculoskeletal: Normal range of motion. She exhibits no edema.  Lymphadenopathy:    She has no cervical adenopathy.  Neurological: She is alert and oriented to person, place, and time. No cranial nerve deficit.  Skin: Skin is warm and dry. No erythema.  Psychiatric: She has a normal  mood and affect. Her behavior is normal. Judgment normal.   Female chaperone present for pelvic and breast  portions of the physical exam  Results: AUDIT Questionnaire (screen for alcoholism): 2 PHQ-9: 1   Assessment: 41 y.o. [id number] female here for routine annual gynecologic examination.  Plan: Problem List Items Addressed This Visit    CIN III (cervical intraepithelial neoplasia grade III) with severe dysplasia   Relevant Orders   IGP,CtNg,AptimaHPV,rfx16/18,45   Dysmenorrhea   Relevant Medications   norgestimate-ethinyl estradiol (ORTHO-CYCLEN,SPRINTEC,PREVIFEM) 0.25-35 MG-MCG tablet   Menorrhagia   Relevant Medications   norgestimate-ethinyl estradiol (ORTHO-CYCLEN,SPRINTEC,PREVIFEM) 0.25-35 MG-MCG tablet    Other Visit Diagnoses    Women's annual routine gynecological examination    -  Primary   Screening for depression       Screening for alcohol problem       Urinary frequency       Relevant Orders   Urine Culture     Screening: -- Blood pressure screen normal -- Colonoscopy - not due -- Mammogram -  not due -- Weight screening: overweight: continue to monitor -- Depression screening negative (PHQ-9) -- Nutrition: normal -- cholesterol screening: not due for screening -- osteoporosis screening: not due -- tobacco screening: not using -- alcohol screening: AUDIT questionnaire indicates low-risk usage. -- family history of breast cancer screening: done. not at high risk. -- no evidence of domestic violence or intimate partner violence. -- STD screening: gonorrhea/chlamydia NAAT collected -- pap smear collected per ASCCP guidelines -- HPV vaccination series: not eligilbe  Urinary Frequency: urine culture  Dysmenorrhea: continue current OCPs. Discussed this ongoing problem with the patient that is somewhat better than 6 months ago. She would like to see how the results from today return and decide if she would like to make any changes to how things are going. We  discussed conservative (medication and without medication) treatment and surgical treatment for her dysmenorrhea and menorrhagia with regular cycle.   15 minutes spent in face to face discussion with > 50% spent in counseling and management of her dysmenorrhea and menorrhagia.   Thomasene Mohair, MD 01/10/2017 2:16 PM

## 2017-01-13 LAB — URINE CULTURE

## 2017-01-18 LAB — IGP,CTNG,APTIMAHPV,RFX16/18,45
Chlamydia, Nuc. Acid Amp: NEGATIVE
GONOCOCCUS BY NUCLEIC ACID AMP: NEGATIVE
HPV Aptima: POSITIVE — AB
PAP Smear Comment: 0

## 2017-01-27 ENCOUNTER — Encounter: Payer: Self-pay | Admitting: Obstetrics and Gynecology

## 2017-01-27 ENCOUNTER — Telehealth: Payer: Self-pay

## 2017-01-27 NOTE — Telephone Encounter (Signed)
Spoke with patient and she is aware of ASC-US HPV + pap smear result.  Discussed need for colposcopy given her CIN III last year and LEEP and CKC procedures last year.  She voiced understanding and will arrange colposcopy.  Results also to be sent through MyChart.

## 2017-01-27 NOTE — Telephone Encounter (Signed)
Pt calling today requesting to speak with SDJ about results from last pap smear and when she should have her next one. Please advise/call pt.

## 2017-02-04 ENCOUNTER — Ambulatory Visit (INDEPENDENT_AMBULATORY_CARE_PROVIDER_SITE_OTHER): Payer: Medicaid Other | Admitting: Obstetrics and Gynecology

## 2017-02-04 ENCOUNTER — Encounter: Payer: Self-pay | Admitting: Obstetrics and Gynecology

## 2017-02-04 VITALS — BP 114/72 | Wt 148.0 lb

## 2017-02-04 DIAGNOSIS — N644 Mastodynia: Secondary | ICD-10-CM

## 2017-02-04 DIAGNOSIS — G43009 Migraine without aura, not intractable, without status migrainosus: Secondary | ICD-10-CM

## 2017-02-04 MED ORDER — BUTALBITAL-APAP-CAFFEINE 50-325-40 MG PO CAPS: 1.0000 | ORAL_CAPSULE | Freq: Four times a day (QID) | ORAL | 0 refills | Status: DC | PRN

## 2017-02-04 NOTE — Progress Notes (Signed)
Obstetrics & Gynecology Office Visit   Chief Complaint  Patient presents with  . Breast Pain  headache  History of Present Illness: 41 y.o. G6P2002 female who presents with concern for breast pain and headache.  Her headache has been present for 9 days.  Her breast pain has also been present for 9 days. She believes the breast pain preceded her headache.  The breast pain is described as being behind her nipples bilaterally.  There was no inciting event.  The pain is described as burning.  She denies feeling any associated lumps or bumps.  She recalls no inciting event.  A warm shower makes her breast pain feel better.  Her clothing makes her pain worse.  Denies leakage of milk, blood, or other substance from her nipples.  She also associates a headache with her breast pain.   The headache is described as frontal in location.  The pain is non-radiating.  Nothing makes the pain better or worse.  She has never had this type of headache before.  She denies a history of migraine headaches.  She has blurry vision occasionally.  She feels tired but denies focal neurologic symptoms, like weakness, numbness, and tingling in any part of her body.  She does take estrogen-containing OCPs, but has not had a problem with them in her lifetime.    Past Medical History:  Diagnosis Date  . Abnormal Pap smear of cervix   . UTI (lower urinary tract infection) 11-2015   resolved per pt and is to f/u with dr Jean Rosenthal prior to upcoming surgery    Past Surgical History:  Procedure Laterality Date  . BREAST ENHANCEMENT SURGERY  2008  . CERVICAL BIOPSY  W/ LOOP ELECTRODE EXCISION    . CERVICAL CONIZATION W/BX N/A 11/25/2015   Procedure: CONIZATION CERVIX WITH BIOPSY;  Surgeon: Conard Novak, MD;  Location: [facility];  Service: Gynecology;  Laterality: N/A;  . NO PAST SURGERIES      Gynecologic History: Patient's last menstrual period was 01/10/2017.  Obstetric History: [id number]  Family History  Problem  Relation Age of Onset  . Colon cancer Mother   . Uterine cancer Sister     Social History   Social History  . Marital status: Single    Spouse name: N/A  . Number of children: N/A  . Years of education: N/A   Occupational History  . Not on file.   Social History Main Topics  . Smoking status: Never Smoker  . Smokeless tobacco: Never Used  . Alcohol use Yes     Comment: OCC  . Drug use: No  . Sexual activity: Yes    Birth control/ protection: Pill   Other Topics Concern  . Not on file   Social History Narrative  . No narrative on file   Allergies: No Known Allergies  Prior to Admission medications   Medication Sig Start Date End Date Taking? Authorizing Provider  norgestimate-ethinyl estradiol (ORTHO-CYCLEN,SPRINTEC,PREVIFEM) 0.25-35 MG-MCG tablet Take 1 tablet by mouth daily. 01/10/17   Conard Novak, MD    Review of Systems  Constitutional: Positive for malaise/fatigue. Negative for chills, diaphoresis, fever and weight loss.  HENT: Negative for congestion, ear discharge, ear pain, hearing loss, nosebleeds, sinus pain, sore throat and tinnitus.   Eyes: Positive for blurred vision (rare, intermittent) and photophobia (mild). Negative for double vision, pain, discharge and redness.  Respiratory: Negative for cough, hemoptysis, sputum production, shortness of breath and wheezing.   Cardiovascular: Negative for chest pain, palpitations, orthopnea,  claudication, leg swelling and PND.  Gastrointestinal: Negative.  Negative for heartburn and nausea.  Genitourinary: Negative.   Musculoskeletal: Negative.   Skin: Negative for itching and rash.  Neurological: Positive for headaches. Negative for dizziness, tingling, tremors, sensory change, speech change, focal weakness, seizures, loss of consciousness and weakness.  Endo/Heme/Allergies: Negative.   Psychiatric/Behavioral: Negative.     Physical Exam BP 114/72   Wt 148 lb (67.1 kg)   LMP 01/10/2017   BMI 27.07 kg/m   Patient's last menstrual period was 01/10/2017. Physical Exam  Constitutional: She is oriented to person, place, and time. She appears well-developed and well-nourished. No distress.  HENT:  Head: Normocephalic and atraumatic.  Eyes: Pupils are equal, round, and reactive to light. Conjunctivae and EOM are normal. No scleral icterus.  Neck: No tracheal deviation present. No thyromegaly present.  Cardiovascular: Normal rate and regular rhythm.  Exam reveals no gallop and no friction rub.   No murmur heard. Pulmonary/Chest: Effort normal and breath sounds normal. No respiratory distress. She has no wheezes. She has no rales. She exhibits no mass, no tenderness, no edema, no swelling and no retraction. Right breast exhibits no inverted nipple, no mass, no nipple discharge, no skin change and no tenderness. Left breast exhibits no inverted nipple, no mass, no nipple discharge, no skin change and no tenderness. Breasts are symmetrical. There is no breast swelling.  Reported tenderness just behind nipple-areolar complex.  No masses noted, no skin changes, no tenderness on exam.  No no nipple discharge.  Abdominal: Soft. Bowel sounds are normal. She exhibits no distension and no mass. There is no tenderness. There is no rebound and no guarding.  Musculoskeletal: Normal range of motion. She exhibits no edema or tenderness.  Lymphadenopathy:    She has no cervical adenopathy.  Neurological: She is alert and oriented to person, place, and time. She has normal strength and normal reflexes. No cranial nerve deficit or sensory deficit. She displays a negative Romberg sign.  Psychiatric: She has a normal mood and affect. Her behavior is normal. Judgment normal.   Female chaperone present for pelvic and breast  portions of the physical exam  Assessment: 41 y.o. G59P2002 female with new-onset headache and breast pain  Plan: Problem List Items Addressed This Visit    None    Visit Diagnoses    Breast pain     -  Primary   Migraine without aura and without status migrainosus, not intractable       Relevant Medications   Butalbital-APAP-Caffeine 50-325-40 MG capsule     Headache: symptoms consistent with migraine. So, will treat as migraine initially.  If she has response, then will have to discontinue estrogen-containing pills due to contraindication.  However, a new-onset headache at age 77, may warrant further workup.  If no response to initial treatment, may have to consider further workup with imaging or have her see a  General practitioner.    Breast pain: no concerning findings on exam.  Even though she had no findings on exam (performed upright and supine), she describes a bit of hyperesthesia.  Discussed conservative measures to reduce friction on nipples.  She will be 41 years old in a couple of weeks. I recommend she have her screening mammogram after age 50.    She is to report to me within a couple of days through messaging whether she has had resolution of her headaches. She is a patient who is well-known to me and has been a very reliable and  compliant patient. So, I trust she will follow up.   As an aside, her abnormal pap smear was again discussed with her. Will have her schedule colposcopy.   Thomasene Mohair, MD 02/06/2017 12:26 PM

## 2017-02-06 ENCOUNTER — Encounter: Payer: Self-pay | Admitting: Obstetrics and Gynecology

## 2017-02-22 ENCOUNTER — Ambulatory Visit (INDEPENDENT_AMBULATORY_CARE_PROVIDER_SITE_OTHER): Payer: Medicaid Other | Admitting: Obstetrics and Gynecology

## 2017-02-22 VITALS — BP 118/74 | Ht 62.0 in | Wt 150.0 lb

## 2017-02-22 DIAGNOSIS — R87810 Cervical high risk human papillomavirus (HPV) DNA test positive: Secondary | ICD-10-CM

## 2017-02-22 DIAGNOSIS — R87610 Atypical squamous cells of undetermined significance on cytologic smear of cervix (ASC-US): Secondary | ICD-10-CM | POA: Insufficient documentation

## 2017-02-22 NOTE — Progress Notes (Signed)
HPI:  Hayley Obrien is a 41 y.o.  [id number]  who presents today for evaluation and management of abnormal cervical cytology.    Dysplasia History:   07/2015 - ASCUS-HPV+, Colpo CIN2-3 => LEEP CIN2-3 with +margins => CKC negative 12/2016 - ASCUS HPV+   OB History  Gravida Para Term Preterm AB Living  SAB TAB Ectopic Multiple Live Births          2    # Outcome Date GA Lbr Len/2nd Weight Sex Delivery Anes PTL Lv  2 Term           1 Term               Past Medical History:  Diagnosis Date  . Abnormal Pap smear of cervix   . UTI (lower urinary tract infection) 11-2015   resolved per pt and is to f/u with dr Jean Rosenthal prior to upcoming surgery    Past Surgical History:  Procedure Laterality Date  . BREAST ENHANCEMENT SURGERY  2008  . CERVICAL BIOPSY  W/ LOOP ELECTRODE EXCISION    . CERVICAL CONIZATION W/BX N/A 11/25/2015   Procedure: CONIZATION CERVIX WITH BIOPSY;  Surgeon: Conard Novak, MD;  Location: [facility];  Service: Gynecology;  Laterality: N/A;  . NO PAST SURGERIES      SOCIAL HISTORY:  History  Alcohol Use  . Yes    Comment: OCC    History  Drug Use No     Family History  Problem Relation Age of Onset  . Colon cancer Mother   . Uterine cancer Sister     ALLERGIES:  Patient has no known allergies.  Current Outpatient Prescriptions on File Prior to Visit  Medication Sig Dispense Refill  . Butalbital-APAP-Caffeine 50-325-40 MG capsule Take 1 capsule by mouth every 6 (six) hours as needed for headache. 30 capsule 0  . norgestimate-ethinyl estradiol (ORTHO-CYCLEN,SPRINTEC,PREVIFEM) 0.25-35 MG-MCG tablet Take 1 tablet by mouth daily. 1 Package 11   No current facility-administered medications on file prior to visit.     Physical Exam: -Vitals:  BP 118/74   Ht  (1.575 m)   Wt 150 lb (68 kg)   LMP 02/07/2017   BMI 27.44 kg/m  GEN: WD, WN, NAD.  A+ O x 3, good mood and affect. ABD:  NT, ND.  Soft, no masses.  No hernias noted.  Pelvic:   Vulva: Normal appearance.  No lesions.  Vagina: No lesions or abnormalities noted.  Support: Normal pelvic support.  Urethra No masses tenderness or scarring.  Meatus Normal size without lesions or prolapse.  Cervix: See below.  Anus: Normal exam.  No lesions.  Perineum: Normal exam.  No lesions.        Bimanual   Uterus: Normal size.  Non-tender.  Mobile.  AV.  Adnexae: No masses.  Non-tender to palpation.  Cul-de-sac: Negative for abnormality.   PROCEDURE: 1.  Urine Pregnancy Test:  negative 2.  Colposcopy performed with 4% acetic acid after verbal consent obtained                                         -Aceto-white Lesions Location(s): 6 and 12 o'clock.              -Biopsy performed at 6 and 12 o'clock               -  ECC indicated and performed: Yes.       -Biopsy sites made hemostatic with pressure, AgNO3, and/or Monsel's solution   -Satisfactory colposcopy: No.    -Evidence of Invasive cervical CA :  NO  ASSESSMENT:  Hayley Obrien is a 41 y.o. [id number] here for  1. ASCUS with positive high risk HPV cervical   .  PLAN: I discussed the grading system of pap smears and HPV high risk viral types.  We will discuss and base management after colpo results return.      Thomasene Mohair, MD  [facility], [facility] 02/22/2017  9:13 AM

## 2017-02-24 LAB — PATHOLOGY

## 2017-05-13 ENCOUNTER — Encounter: Payer: Self-pay | Admitting: Obstetrics and Gynecology

## 2017-06-01 ENCOUNTER — Encounter: Payer: Self-pay | Admitting: Obstetrics and Gynecology

## 2017-12-01 ENCOUNTER — Encounter: Payer: Self-pay | Admitting: Obstetrics and Gynecology

## 2017-12-01 ENCOUNTER — Other Ambulatory Visit (HOSPITAL_COMMUNITY)
Admission: RE | Admit: 2017-12-01 | Discharge: 2017-12-01 | Disposition: A | Discharge status: Home / Self Care | Payer: Medicaid Other | Source: Ambulatory Visit | Attending: Obstetrics and Gynecology | Admitting: Obstetrics and Gynecology

## 2017-12-01 ENCOUNTER — Ambulatory Visit (INDEPENDENT_AMBULATORY_CARE_PROVIDER_SITE_OTHER): Payer: Self-pay | Admitting: Obstetrics and Gynecology

## 2017-12-01 VITALS — BP 104/62 | HR 73 | Wt 150.0 lb

## 2017-12-01 DIAGNOSIS — Z3201 Encounter for pregnancy test, result positive: Secondary | ICD-10-CM

## 2017-12-01 DIAGNOSIS — O0990 Supervision of high risk pregnancy, unspecified, unspecified trimester: Secondary | ICD-10-CM | POA: Diagnosis present

## 2017-12-01 DIAGNOSIS — Z113 Encounter for screening for infections with a predominantly sexual mode of transmission: Secondary | ICD-10-CM

## 2017-12-01 DIAGNOSIS — Z3A01 Less than 8 weeks gestation of pregnancy: Secondary | ICD-10-CM

## 2017-12-01 DIAGNOSIS — O09529 Supervision of elderly multigravida, unspecified trimester: Secondary | ICD-10-CM | POA: Insufficient documentation

## 2017-12-01 DIAGNOSIS — Z124 Encounter for screening for malignant neoplasm of cervix: Secondary | ICD-10-CM

## 2017-12-01 DIAGNOSIS — O0991 Supervision of high risk pregnancy, unspecified, first trimester: Secondary | ICD-10-CM | POA: Diagnosis not present

## 2017-12-01 DIAGNOSIS — Z9889 Other specified postprocedural states: Secondary | ICD-10-CM

## 2017-12-01 DIAGNOSIS — N926 Irregular menstruation, unspecified: Secondary | ICD-10-CM

## 2017-12-01 DIAGNOSIS — O09521 Supervision of elderly multigravida, first trimester: Secondary | ICD-10-CM

## 2017-12-01 LAB — POCT URINE PREGNANCY: PREG TEST UR: POSITIVE — AB

## 2017-12-01 NOTE — Progress Notes (Signed)
New Obstetric Patient H&P   Chief Complaint: "Desires prenatal care"   History of Present Illness: Patient is a 42 y.o. G76P2002 Hispanic or Latino female, LMP 10/22/2017 presents with amenorrhea and positive home pregnancy test. Based on her  LMP, her EDD is Estimated Date of Delivery: 07/29/18 and her EGA is [redacted]w[redacted]d. Cycles are 4. days, regular, and occur approximately every : 28 days. Her last pap smear was 1 years ago and was ASCUS with POSITIVE high risk HPV.    She had a urine pregnancy test which was positive 4 day(s)  ago. Her last menstrual period was normal and lasted for  2 day(s). Since her LMP she claims she has experienced no issues. She denies vaginal bleeding. Her past medical history is noncontributory. Her prior pregnancies are notable for none  Since her LMP, she admits to the use of tobacco products  no She claims she has gained zero pounds since the start of her pregnancy.  There are cats in the home in the home  no   She admits close contact with children on a regular basis  yes  She has had chicken pox in the past yes She has had Tuberculosis exposures, symptoms, or previously tested positive for TB   no Current or past history of domestic violence. no  Genetic Screening/Teratology Counseling: (Includes patient, baby's father, or anyone in either family with:)   1. Patient's age >/= 33 at [facility]  yes 2. Thalassemia ([place], [place], Mediterranean, or Asian background): MCV<80  no 3. Neural tube defect (meningomyelocele, spina bifida, anencephaly)  no 4. Congenital heart defect  no  5. Down syndrome  no 6. Tay-Sachs (Jewish, [place]  no 7. Canavan's Disease  no 8. Sickle cell disease or trait (African)  no  9. Hemophilia or other blood disorders  no  10. Muscular dystrophy  no  11. Cystic fibrosis  no  12. Huntington's Chorea  no  13. Mental retardation/autism  no 14. Other inherited genetic or chromosomal disorder  no 15. Maternal metabolic disorder (DM, PKU,  etc)  no 16. Patient or FOB with a child with a birth defect not listed above no  16a. Patient or FOB with a birth defect themselves no 17. Recurrent pregnancy loss, or stillbirth  no  18. Any medications since LMP other than prenatal vitamins (include vitamins, supplements, OTC meds, drugs, alcohol)  no 19. Any other genetic/environmental exposure to discuss  no  Infection History:   1. Lives with someone with TB or TB exposed  no  2. Patient or partner has history of genital herpes  no 3. Rash or viral illness since LMP  no 4. History of STI (GC, CT, HPV, syphilis, HIV)  HPV 5. History of recent travel :  no  Other pertinent information:  History of LEEP and Cold Knife Conization in 2017   Review of Systems:10 point review of systems negative unless otherwise noted in HPI  Past Medical History:  Diagnosis Date  . Abnormal Pap smear of cervix   . UTI (lower urinary tract infection) 11-2015   resolved per pt and is to f/u with dr Jean Rosenthal prior to upcoming surgery    Past Surgical History:  Procedure Laterality Date  . BREAST ENHANCEMENT SURGERY  2008  . CERVICAL BIOPSY  W/ LOOP ELECTRODE EXCISION    . CERVICAL CONIZATION W/BX N/A 11/25/2015   Procedure: CONIZATION CERVIX WITH BIOPSY;  Surgeon: Conard Novak, MD;  Location: [facility] ORS;  Service: Gynecology;  Laterality: N/A;  .  NO PAST SURGERIES      Gynecologic History: Patient's last menstrual period was 10/22/2017.  Obstetric History: G3P2002,  G1 - 1995, SVD, 9 pounds G2 - 2001, SVD, 8 pounds G3 - current  Family History  Problem Relation Age of Onset  . Colon cancer Mother   . Uterine cancer Sister     Social History   Socioeconomic History  . Marital status: Single    Spouse name: Not on file  . Number of children: Not on file  . Years of education: Not on file  . Highest education level: Not on file  Occupational History  . Not on file  Social Needs  . Financial resource strain: Not on file  . Food  insecurity:    Worry: Not on file    Inability: Not on file  . Transportation needs:    Medical: Not on file    Non-medical: Not on file  Tobacco Use  . Smoking status: Never Smoker  . Smokeless tobacco: Never Used  Substance and Sexual Activity  . Alcohol use: Yes    Comment: OCC  . Drug use: No  . Sexual activity: Yes    Birth control/protection: Pill  Lifestyle  . Physical activity:    Days per week: Not on file    Minutes per session: Not on file  . Stress: Not on file  Relationships  . Social connections:    Talks on phone: Not on file    Gets together: Not on file    Attends religious service: Not on file    Active member of club or organization: Not on file    Attends meetings of clubs or organizations: Not on file    Relationship status: Not on file  . Intimate partner violence:    Fear of current or ex partner: Not on file    Emotionally abused: Not on file    Physically abused: Not on file    Forced sexual activity: Not on file  Other Topics Concern  . Not on file  Social History Narrative  . Not on file   Allergies: No Known Allergies  Medications: none     Physical Exam BP 104/62   Pulse 73   Wt 150 lb (68 kg)   LMP 10/22/2017   BMI 27.44 kg/m   Physical Exam  Constitutional: She is oriented to person, place, and time. She appears well-developed and well-nourished. No distress.  HENT:  Head: Normocephalic and atraumatic.  Eyes: Conjunctivae are normal.  Neck: Normal range of motion. Neck supple. No thyromegaly present.  Cardiovascular: Normal rate, regular rhythm and normal heart sounds. Exam reveals no gallop and no friction rub.  No murmur heard. Pulmonary/Chest: Effort normal and breath sounds normal. She has no wheezes.  Abdominal: Soft. She exhibits no distension and no mass. There is no tenderness. There is no rebound and no guarding. No hernia. Hernia confirmed negative in the right inguinal area and confirmed negative in the left inguinal  area.  Genitourinary: Vagina normal. Pelvic exam was performed with patient supine. There is no rash, tenderness or lesion on the right labia. There is no rash, tenderness or lesion on the left labia. Uterus is enlarged (6-8 week). Uterus is not deviated, not fixed and not tender. Cervix exhibits no motion tenderness. Right adnexum displays no mass, no tenderness and no fullness. Left adnexum displays no mass, no tenderness and no fullness.  Musculoskeletal: Normal range of motion.  Lymphadenopathy:       Right:  No inguinal adenopathy present.       Left: No inguinal adenopathy present.  Neurological: She is alert and oriented to person, place, and time.  Skin: Skin is warm and dry. No rash noted.  Psychiatric: She has a normal mood and affect. Her behavior is normal. Judgment normal.     Female Chaperone present during breast and/or pelvic exam.   Assessment: 42 y.o. G3P2002 at [redacted]w[redacted]d presenting to initiate prenatal care  Plan: 1) Avoid alcoholic beverages. 2) Patient encouraged not to smoke. u 3) Discontinue the use of all non-medicinal drugs and chemicals.  4) Take prenatal vitamins daily.  5) Nutrition, food safety (fish, cheese advisories, and high nitrite foods) and exercise discussed. 6) Hospital and practice style discussed with cross coverage system.  7) Genetic Screening, such as with 1st Trimester Screening, cell free fetal DNA, AFP testing, and Ultrasound, as well as with amniocentesis and CVS as appropriate, is discussed with patient. At the conclusion of today's visit patient accepted genetic testing. Carrier testing discussed and she declines. Will get NIPT after 10 weeks.  8) Patient is asked about travel to areas at risk for the Zika virus, and counseled to avoid travel and exposure to mosquitoes or sexual partners who may have themselves been exposed to the virus. Testing is discussed, and will be ordered as appropriate.   Thomasene Mohair, MD 12/01/2017 11:04 AM

## 2017-12-05 LAB — CYTOLOGY - PAP
Chlamydia: NEGATIVE
Diagnosis: NEGATIVE
HPV: NOT DETECTED
Neisseria Gonorrhea: NEGATIVE

## 2017-12-08 ENCOUNTER — Ambulatory Visit (INDEPENDENT_AMBULATORY_CARE_PROVIDER_SITE_OTHER): Payer: Self-pay

## 2017-12-08 ENCOUNTER — Encounter: Payer: Self-pay | Admitting: Obstetrics and Gynecology

## 2017-12-08 ENCOUNTER — Ambulatory Visit (INDEPENDENT_AMBULATORY_CARE_PROVIDER_SITE_OTHER): Payer: Medicaid Other | Admitting: Obstetrics and Gynecology

## 2017-12-08 VITALS — BP 110/62 | Wt 147.5 lb

## 2017-12-08 DIAGNOSIS — Z3A01 Less than 8 weeks gestation of pregnancy: Secondary | ICD-10-CM

## 2017-12-08 DIAGNOSIS — N8312 Corpus luteum cyst of left ovary: Secondary | ICD-10-CM

## 2017-12-08 DIAGNOSIS — O0990 Supervision of high risk pregnancy, unspecified, unspecified trimester: Secondary | ICD-10-CM

## 2017-12-08 DIAGNOSIS — Z9889 Other specified postprocedural states: Secondary | ICD-10-CM

## 2017-12-08 DIAGNOSIS — O09521 Supervision of elderly multigravida, first trimester: Secondary | ICD-10-CM

## 2017-12-08 DIAGNOSIS — O3411 Maternal care for benign tumor of corpus uteri, first trimester: Secondary | ICD-10-CM

## 2017-12-08 NOTE — Addendum Note (Signed)
Addended by: Ellin GoodieLOWE, CASANDRA S on: 12/08/2017 04:00 PM   Modules accepted: Orders

## 2017-12-08 NOTE — Addendum Note (Signed)
Addended by: Ellin GoodieLOWE, CASANDRA S on: 12/08/2017 04:27 PM   Modules accepted: Orders

## 2017-12-08 NOTE — Progress Notes (Signed)
Routine Prenatal Care Visit  Subjective  Hayley Obrien is a 42 y.o. G3P2002 at [redacted]w[redacted]d being seen today for ongoing prenatal care.  She is currently monitored for the following issues for this high-risk pregnancy and has CIN III (cervical intraepithelial neoplasia grade III) with severe dysplasia; Dysmenorrhea; Menorrhagia; ASCUS with positive high risk HPV cervical; Supervision of high risk pregnancy, antepartum; History of conization of cervix; and Supervision of elderly multigravida in first trimester on their problem list.  ----------------------------------------------------------------------------------- Patient reports no complaints.    . Vag. Bleeding: None.   . Denies leaking of fluid.  U/S shows gestational sac, no fetal pole, no yolk sac ----------------------------------------------------------------------------------- The following portions of the patient's history were reviewed and updated as appropriate: allergies, current medications, past family history, past medical history, past social history, past surgical history and problem list. Problem list updated.   Objective  Blood pressure 110/62, weight 147 lb 8 oz (66.9 kg), last menstrual period 10/22/2017. Pregravid weight 150 lb (68 kg) Total Weight Gain -2 lb 8 oz (-1.134 kg) Urinalysis:      Fetal Status:           General:  Alert, oriented and cooperative. Patient is in no acute distress.  Skin: Skin is warm and dry. No rash noted.   Cardiovascular: Normal heart rate noted  Respiratory: Normal respiratory effort, no problems with respiration noted  Abdomen: Soft, gravid, appropriate for gestational age.       Pelvic:  Cervical exam deferred        Extremities: Normal range of motion.     Mental Status: Normal mood and affect. Normal behavior. Normal judgment and thought content.   US Ob Comp Less 14 Wks  Result Date: 12/08/2017 ULTRASOUND REPORT Patient Name: Camellia Popescu DOB: [birth year] MRN: [id number] Location:  [facility] Date of Service: 12/08/2017 Indications:dating Findings: Mason Jim intrauterine gestational sac is visualized without a yolk sac or embryo. Too small to calculated dates by gestational sac. The (U/S) EDD is not consistent with the clinically established EDD of 07/29/2018. FHR: n/a CRL measurement: n/a Yolk sac is not visualized and early anatomy is normal. Amnion: not visualized Right Ovary is normal in appearance. Left Ovary is normal appearance. Corpus luteal cyst:  Left ovary Survey of the adnexa demonstrates no adnexal masses. There is no free peritoneal fluid in the cul de sac. Impression: 1. Singleton intrauterine gestational sac is visualized without a yolk sac or embryo.     Too small to calculated dates by gestational sac. 2. (U/S) EDD is not consistent with Clinically established EDD of 07/29/2018. Recommendations: 1.Clinical correlation with the patient's History and Physical Exam. 2. Follow up in 2 weeks for viability* Kristen Priestley, RDMS, RVT There is a viable singleton gestation.  Detailed evaluation of the fetal anatomy is precluded by early gestational age.  It must be noted that a normal ultrasound particular at this early gestational age is unable to rule out fetal aneuploidy, risk of first trimester miscarriage, or anatomic birth defects. Thomasene Mohair, MD, [facility], [facility] 12/08/2017 1:05 PM  *Criteria from the Society of Radiologists in Ultrasound Multispecialty Consensus Conference on Early First Trimester Diagnosis of Miscarriage and Exclusion of a Viable Intrauterine Pregnancy, October 2012.    Assessment   42 y.o. [id number] at [redacted]w[redacted]d by  07/29/2018, by Last Menstrual Period presenting for routine prenatal visit  Plan   Pregnancy #3 Problems (from 12/01/17 to present)    Problem Noted Resolved   Supervision of high risk  pregnancy, antepartum 12/01/2017 by Conard NovakJackson, Nallely Yost D, MD No   History of conization of cervix 12/01/2017 by Conard NovakJackson,  Diera Wirkkala D, MD No   Overview Signed 12/01/2017 11:24 AM by Conard NovakJackson, Dorr Perrot D, MD    LEEP and CKC in 2017      Supervision of elderly multigravida in first trimester 12/01/2017 by Conard NovakJackson, Kinzy Weyers D, MD No       Preterm labor symptoms and general obstetric precautions including but not limited to vaginal bleeding, contractions, leaking of fluid and fetal movement were reviewed in detail with the patient. Please refer to After Visit Summary for other counseling recommendations.   Urine culture today.   Return in 2 weeks (on 12/22/2017) for schedule u/s (no sooner than 14 days) and follow up Dr. Jean RosenthalJackson.  Thomasene MohairStephen Oreta Soloway, MD, Merlinda FrederickFACOG [facility], [facility] 12/08/2017 1:46 PM

## 2017-12-10 LAB — URINE CULTURE

## 2017-12-15 ENCOUNTER — Encounter: Payer: Self-pay | Admitting: Obstetrics and Gynecology

## 2017-12-20 ENCOUNTER — Encounter: Payer: Self-pay | Admitting: Obstetrics and Gynecology

## 2017-12-20 ENCOUNTER — Ambulatory Visit (INDEPENDENT_AMBULATORY_CARE_PROVIDER_SITE_OTHER): Payer: Medicaid Other | Admitting: Obstetrics and Gynecology

## 2017-12-20 VITALS — BP 114/68 | Wt 149.0 lb

## 2017-12-20 DIAGNOSIS — O09521 Supervision of elderly multigravida, first trimester: Secondary | ICD-10-CM

## 2017-12-20 DIAGNOSIS — O9989 Other specified diseases and conditions complicating pregnancy, childbirth and the puerperium: Secondary | ICD-10-CM

## 2017-12-20 DIAGNOSIS — Z9889 Other specified postprocedural states: Secondary | ICD-10-CM

## 2017-12-20 DIAGNOSIS — O0990 Supervision of high risk pregnancy, unspecified, unspecified trimester: Secondary | ICD-10-CM

## 2017-12-20 DIAGNOSIS — R1030 Lower abdominal pain, unspecified: Secondary | ICD-10-CM

## 2017-12-20 DIAGNOSIS — Z3A08 8 weeks gestation of pregnancy: Secondary | ICD-10-CM

## 2017-12-20 NOTE — Progress Notes (Incomplete)
  Routine Prenatal Care Visit  Subjective  Hayley Obrien is a 42 y.o. G3P2002 at 527w3d being seen today for ongoing prenatal care.  She is currently monitored for the following issues for this {Blank single:19197::"high-risk","low-risk"} pregnancy and has CIN III (cervical intraepithelial neoplasia grade III) with severe dysplasia; Dysmenorrhea; Menorrhagia; ASCUS with positive high risk HPV cervical; Supervision of high risk pregnancy, antepartum; History of conization of cervix; and Supervision of elderly multigravida in first trimester on their problem list.  ----------------------------------------------------------------------------------- Patient reports {sx:14538}.    . Vag. Bleeding: None.   . Denies leaking of fluid.  ----------------------------------------------------------------------------------- The following portions of the patient's history were reviewed and updated as appropriate: allergies, current medications, past family history, past medical history, past social history, past surgical history and problem list. Problem list updated.   Objective  Blood pressure 114/68, weight 149 lb (67.6 kg), last menstrual period 10/22/2017. Pregravid weight 150 lb (68 kg) Total Weight Gain -1 lb (-0.454 kg) Urinalysis:      Fetal Status: Fetal Heart Rate (bpm): Present         General:  Alert, oriented and cooperative. Patient is in no acute distress.  Skin: Skin is warm and dry. No rash noted.   Cardiovascular: Normal heart rate noted  Respiratory: Normal respiratory effort, no problems with respiration noted  Abdomen: Soft, gravid, appropriate for gestational age.       Pelvic:  {Blank single:19197::"Cervical exam performed","Cervical exam deferred"}        Extremities: Normal range of motion.     Mental Status: Normal mood and affect. Normal behavior. Normal judgment and thought content.   Assessment   42 y.o. [id number] at [redacted]w[redacted]d by  07/29/2018, by Last Menstrual Period presenting for  {Blank single:19197::"routine","work-in"} prenatal visit  Plan   Pregnancy #3 Problems (from 12/01/17 to present)    Problem Noted Resolved   Supervision of high risk pregnancy, antepartum 12/01/2017 by Conard NovakJackson, Stephen D, MD No   History of conization of cervix 12/01/2017 by Conard NovakJackson, Stephen D, MD No   Overview Signed 12/01/2017 11:24 AM by Conard NovakJackson, Stephen D, MD    LEEP and CKC in 2017      Supervision of elderly multigravida in first trimester 12/01/2017 by Conard NovakJackson, Stephen D, MD No       {Blank single:19197::"Term","Preterm"} labor symptoms and general obstetric precautions including but not limited to vaginal bleeding, contractions, leaking of fluid and fetal movement were reviewed in detail with the patient. Please refer to After Visit Summary for other counseling recommendations.   Return in 1 week (on 12/27/2017) for Keep previously scheduled ROB appt.  Thomasene MohairStephen Jackson, MD, Merlinda FrederickFACOG [facility], [facility] 12/20/2017 4:54 PM

## 2017-12-20 NOTE — Progress Notes (Signed)
Work-In Prenatal Care Visit  Subjective  Hayley Obrien is a 42 y.o. G3P2002 at 33359w3d being seen today for ongoing prenatal care.  She is currently monitored for the following issues for this high-risk pregnancy and has CIN III (cervical intraepithelial neoplasia grade III) with severe dysplasia; Dysmenorrhea; Menorrhagia; ASCUS with positive high risk HPV cervical; Supervision of high risk pregnancy, antepartum; History of conization of cervix; and Supervision of elderly multigravida in first trimester on their problem list.  ----------------------------------------------------------------------------------- Patient reports diffuse lower abdominal pain for the past 1-2 weeks, right side worse than left, with some mild associated lower back pain. Nothing seems to make the pain better or worse.  The pain is noted to be getting worse. She rates the pain as moderate.  Associated symptoms include nausea without emesis, decreased appetite, fevers (100.61F at home on a couple of occasions).  She is [redacted] weeks pregnant. She denies sick contacts. She denies diarrhea and constipation.  She has been voiding more frequently than normal with mild dysuria noted.  . Vag. Bleeding: None.   . Denies leaking of fluid.  ----------------------------------------------------------------------------------- The following portions of the patient's history were reviewed and updated as appropriate: allergies, current medications, past family history, past medical history, past social history, past surgical history and problem list. Problem list updated.  Objective  Blood pressure 114/68, weight 149 lb (67.6 kg), last menstrual period 10/22/2017. Pregravid weight 150 lb (68 kg) Total Weight Gain -1 lb (-0.454 kg) Urinalysis:      Fetal Status: Fetal Heart Rate (bpm): Present          General:  Alert, oriented and cooperative. Patient is in no acute distress.  Skin: Skin is warm and dry. No rash noted.   Cardiovascular: Normal  heart rate noted  Respiratory: Normal respiratory effort, no problems with respiration noted  Abdomen: Soft, mild ttp in lower abdomen. Pain locating to midline around umbilicus upon LLQ palpation. Pain in RLQ upon palpation. No rebound tenderness or guarding.  No masses noted. No CVAT, negative obturator sign.   Pelvic:  Cervical exam deferred        Extremities: Normal range of motion.     Mental Status: Normal mood and affect. Normal behavior. Normal judgment and thought content.   BSUS: single, living IUP with positive and normal fetal cardiac activity.  Limited acoustics given the old ultrasound machine. However, a small subchorionic hemorrhage could not be ruled out.  No other pathology noted.  Assessment   42 y.o. [id number] at 7359w3d by  07/29/2018, by Last Menstrual Period presenting for work-in prenatal visit  Plan   Pregnancy #3 Problems (from 12/01/17 to present)    Problem Noted Resolved   Supervision of high risk pregnancy, antepartum 12/01/2017 by Conard NovakJackson, Stephen D, MD No   History of conization of cervix 12/01/2017 by Conard NovakJackson, Stephen D, MD No   Overview Signed 12/01/2017 11:24 AM by Conard NovakJackson, Stephen D, MD    LEEP and CKC in 2017      Supervision of elderly multigravida in first trimester 12/01/2017 by Conard NovakJackson, Stephen D, MD No     * Lower abdominal pain, right greater than left: I discussed the differential with her. I discussed that I could not rule out appendicitis given her symptoms and the location of the symptoms. We discussed gastritis and pregnancy-related discomfort.  Will send urine specimen for UTI.  Discussed obtaining an MRI for her RLQ to investigate appendicitis.  She declines at this time. We discussed the risks of this. She  understands to go to the ER should her pain get worse or not improve in the next few days. We discussed that MRIs are safe in pregnancy and even surgery for appendicitis is safe in pregnancy and is much safer than having a ruptured appendix, which  could be life-threatening and pregnancy-threatening.  She voiced understanding of this information. She is able to contact me directly through [employer] and will do so with any further questions.    20 minutes spent in face to face discussion with > 50% spent in counseling,management, and coordination of care of her diffuse lower abdominal pain.   Preterm labor symptoms and general obstetric precautions including but not limited to vaginal bleeding, contractions, leaking of fluid and fetal movement were reviewed in detail with the patient. Please refer to After Visit Summary for other counseling recommendations.   Return in 1 week (on 12/27/2017) for Keep previously scheduled ROB appt.  Thomasene Mohair, MD, [facility], [facility] 12/20/2017 4:54 PM

## 2017-12-22 LAB — URINE CULTURE

## 2017-12-27 ENCOUNTER — Encounter: Payer: Self-pay | Admitting: Obstetrics and Gynecology

## 2017-12-27 ENCOUNTER — Ambulatory Visit (INDEPENDENT_AMBULATORY_CARE_PROVIDER_SITE_OTHER): Payer: Medicaid Other | Admitting: Obstetrics and Gynecology

## 2017-12-27 ENCOUNTER — Ambulatory Visit (INDEPENDENT_AMBULATORY_CARE_PROVIDER_SITE_OTHER): Payer: Medicaid Other

## 2017-12-27 VITALS — BP 110/62 | Wt 148.2 lb

## 2017-12-27 DIAGNOSIS — Z9889 Other specified postprocedural states: Secondary | ICD-10-CM

## 2017-12-27 DIAGNOSIS — Z3A08 8 weeks gestation of pregnancy: Secondary | ICD-10-CM | POA: Diagnosis not present

## 2017-12-27 DIAGNOSIS — N8312 Corpus luteum cyst of left ovary: Secondary | ICD-10-CM | POA: Diagnosis not present

## 2017-12-27 DIAGNOSIS — O0990 Supervision of high risk pregnancy, unspecified, unspecified trimester: Secondary | ICD-10-CM

## 2017-12-27 DIAGNOSIS — O3411 Maternal care for benign tumor of corpus uteri, first trimester: Secondary | ICD-10-CM

## 2017-12-27 DIAGNOSIS — O09521 Supervision of elderly multigravida, first trimester: Secondary | ICD-10-CM

## 2017-12-27 LAB — POCT URINALYSIS DIPSTICK OB
Glucose, UA: NEGATIVE — AB
POC,PROTEIN,UA: NEGATIVE

## 2017-12-27 MED ORDER — PROVIDA DHA 16-16-1.25-110 MG PO CAPS: 1.0000 | ORAL_CAPSULE | Freq: Every day | ORAL | 8 refills | Status: DC

## 2017-12-27 NOTE — Patient Instructions (Signed)
Primer trimestre de embarazo °(First Trimester of Pregnancy) °El primer trimestre de embarazo se extiende desde la semana 1 hasta el final de la semana 12 (mes 1 al mes 3). Durante este tiempo, el bebé comenzará a desarrollarse dentro suyo. Entre la semana 6 y la 8, se forman los ojos y el rostro, y los latidos del corazón pueden verse en la ecografía. Al final de las 12 semanas, todos los órganos del bebé están formados. La atención prenatal es toda la asistencia médica que usted recibe antes del nacimiento del bebé. Asegúrese de recibir una buena atención prenatal y de seguir todas las indicaciones del médico. °CUIDADOS EN EL HOGAR °Medicamentos: °· Tome los medicamentos solamente como se lo haya indicado el médico. Algunos medicamentos se pueden tomar durante el embarazo y otros no. °· Tome las vitaminas prenatales como se lo haya indicado el médico. °· Tome el medicamento que la ayuda a defecar (laxante suave) según sea necesario, si el médico lo autoriza. °Dieta °· Ingiera alimentos saludables de manera regular. °· El médico le indicará la cantidad de peso que puede aumentar. °· No coma carne cruda ni quesos sin cocinar. °· Si tiene malestar estomacal (náuseas) o vomita: °? Ingiera 4 o 5 comidas pequeñas por día en lugar de 3 abundantes. °? Intente comer algunas galletitas saladas. °? Beba líquidos entre las comidas, en lugar de hacerlo durante estas. °· Si tiene dificultad para defecar (estreñimiento): °? Consuma alimentos con alto contenido de fibra, como verduras y frutas frescos, y cereales integrales. °? Beba suficiente líquido para mantener el pis (orina) claro o de color amarillo pálido. °Actividad y ejercicios °· Haga ejercicios solamente como se lo haya indicado el médico. Deje de hacer ejercicios si tiene cólicos o dolor en la parte baja del vientre (abdomen) o en la cintura. °· Intente no estar de pie durante mucho tiempo. Mueva las piernas con frecuencia si debe estar de pie en un lugar durante  mucho tiempo. °· Evite levantar pesos excesivos. °· Use zapatos con tacones bajos. Mantenga una buena postura al sentarse y pararse. °· Puede tener relaciones sexuales, a menos que el médico le indique lo contrario. °Alivio del dolor o las molestias °· Use un sostén que le brinde buen soporte si le duelen las mamas. °· Dese baños con agua tibia (baños de asiento) para aliviar el dolor o las molestias a causa de las hemorroides. Use crema antihemorroidal si el médico se lo permite. °· Descanse con las piernas elevadas si tiene calambres o dolor de cintura. °· Use medias de descanso si tiene las venas de las piernas hinchadas y abultadas (venas varicosas). Eleve los pies durante 15 minutos, 3 o 4 veces por día. Limite la cantidad de sal en su dieta. °Cuidados prenatales °· Programe las visitas prenatales para la semana 12 de embarazo. °· Escriba sus preguntas. Llévelas cuando concurra a las visitas prenatales. °· Concurra a todas las visitas prenatales como se lo haya indicado el médico. °Seguridad °· Colóquese el cinturón de seguridad cuando conduzca. °· Haga una lista con los números de teléfono en caso de emergencia, en la cual deben incluirse los números de los familiares, los amigos, el hospital y los departamentos de policía y de bomberos. °Consejos generales °· Pídale al médico que la derive a clases prenatales en su localidad. Debe comenzar a tomar las clases antes de entrar en el mes 6 de embarazo. °· Pida ayuda si necesita asesoramiento o asistencia con la alimentación. El médico puede aconsejarla o indicarle dónde recurrir para recibir ayuda. °· No se dé baños de inmersión en agua caliente, baños   turcos ni saunas. °· No se haga duchas vaginales ni use tampones o toallas higiénicas perfumadas. °· No mantenga las piernas cruzadas durante mucho tiempo. °· Evite el contacto con las bandejas sanitarias de los gatos y la tierra que estos animales usan. °· No fume, no consuma hierbas ni beba alcohol. No tome  fármacos que el médico no haya autorizado. °· No consuma ningún producto que contenga tabaco, lo que incluye cigarrillos, tabaco de mascar o cigarrillos electrónicos. Si necesita ayuda para dejar de fumar, consulte al médico. Puede recibir asesoramiento u otro tipo de apoyo para dejar de fumar. °· Visite al dentista. En su casa, lávese los dientes con un cepillo dental suave. Pásese el hilo dental con suavidad. °SOLICITE AYUDA SI: °· Tiene mareos. °· Tiene cólicos leves o siente presión en la parte baja del vientre. °· Siente un dolor persistente en la zona del vientre. °· Sigue teniendo malestar estomacal, vomita o las heces son líquidas (diarrea). °· Observa una secreción, con mal olor que proviene de la vagina. °· Siente dolor al orinar. °· Tiene el rostro, las manos, las piernas o los tobillos más hinchados (inflamados). ° °SOLICITE AYUDA DE INMEDIATO SI: °· Tiene fiebre. °· Tiene una pérdida de líquido por la vagina. °· Tiene sangrado o pequeñas pérdidas vaginales. °· Tiene cólicos o dolor muy intensos en el vientre. °· Sube o baja de peso rápidamente. °· Vomita sangre. Puede ser similar a la borra del café °· Está en contacto con personas que tienen rubéola, la quinta enfermedad o varicela. °· Siente un dolor de cabeza muy intenso. °· Le falta el aire. °· Sufre cualquier tipo de traumatismo, por ejemplo, debido a una caída o un accidente automovilístico. ° °Esta información no tiene como fin reemplazar el consejo del médico. Asegúrese de hacerle al médico cualquier pregunta que tenga. °Document Released: 07/30/2008 Document Revised: 05/24/2014 Document Reviewed: 03/13/2013 °Elsevier Interactive Patient Education © 2017 [employer] ° °

## 2017-12-27 NOTE — Addendum Note (Signed)
Addended by: Thomasene MohairJACKSON, Athene Schuhmacher D on: 12/27/2017 03:34 PM   Modules accepted: Orders

## 2017-12-27 NOTE — Progress Notes (Signed)
  Routine Prenatal Care Visit  Subjective  Hayley Obrien is a 42 y.o. G3P2002 at 6891w2d being seen today for ongoing prenatal care.  She is currently monitored for the following issues for this high-risk pregnancy and has CIN III (cervical intraepithelial neoplasia grade III) with severe dysplasia; Dysmenorrhea; Menorrhagia; ASCUS with positive high risk HPV cervical; Supervision of high risk pregnancy, antepartum; History of conization of cervix; and Supervision of elderly multigravida in first trimester on their problem list.  ----------------------------------------------------------------------------------- Patient reports no complaints.  Feeling much better than before.  Still with occasional RLQ cramping.  . Vag. Bleeding: None.   . Denies leaking of fluid.  ----------------------------------------------------------------------------------- The following portions of the patient's history were reviewed and updated as appropriate: allergies, current medications, past family history, past medical history, past social history, past surgical history and problem list. Problem list updated.   Objective  Blood pressure 110/62, weight 148 lb 4 oz (67.2 kg), last menstrual period 10/22/2017. Pregravid weight 150 lb (68 kg) Total Weight Gain -1 lb 12 oz (-0.794 kg) Urinalysis:      Fetal Status: Fetal Heart Rate (bpm): present         General:  Alert, oriented and cooperative. Patient is in no acute distress.  Skin: Skin is warm and dry. No rash noted.   Cardiovascular: Normal heart rate noted  Respiratory: Normal respiratory effort, no problems with respiration noted  Abdomen: Soft, gravid, appropriate for gestational age.       Pelvic:  Cervical exam deferred        Extremities: Normal range of motion.  Edema: None  Mental Status: Normal mood and affect. Normal behavior. Normal judgment and thought content.   Assessment   42 y.o. [id number] at 8391w2d by  08/06/2018, by Ultrasound presenting for  routine prenatal visit  Plan   Pregnancy #3 Problems (from 12/01/17 to present)    Problem Noted Resolved   Supervision of high risk pregnancy, antepartum 12/01/2017 by Conard NovakJackson, Kimmarie Pascale D, MD No   History of conization of cervix 12/01/2017 by Conard NovakJackson, Deny Chevez D, MD No   Overview Addendum 12/27/2017  2:37 PM by Conard NovakJackson, Virlan Kempker D, MD    LEEP and CKC in 2017 [ ]  u/s at 16 weeks for cervical length       Supervision of elderly multigravida in first trimester 12/01/2017 by Conard NovakJackson, Marce Charlesworth D, MD No       Preterm labor symptoms and general obstetric precautions including but not limited to vaginal bleeding, contractions, leaking of fluid and fetal movement were reviewed in detail with the patient. Please refer to After Visit Summary for other counseling recommendations.   Return in about 4 weeks (around 01/24/2018) for Routine Prenatal Appointment with Dr. Jean RosenthalJackson.  Thomasene MohairStephen Kanyia Heaslip, MD, Merlinda FrederickFACOG [facility], [facility] 12/27/2017 2:55 PM

## 2018-01-24 ENCOUNTER — Encounter: Payer: Self-pay | Admitting: Obstetrics and Gynecology

## 2018-01-24 ENCOUNTER — Ambulatory Visit (INDEPENDENT_AMBULATORY_CARE_PROVIDER_SITE_OTHER): Payer: Medicaid Other | Admitting: Obstetrics and Gynecology

## 2018-01-24 VITALS — BP 110/64 | Wt 147.0 lb

## 2018-01-24 DIAGNOSIS — Z9889 Other specified postprocedural states: Secondary | ICD-10-CM

## 2018-01-24 DIAGNOSIS — Z1379 Encounter for other screening for genetic and chromosomal anomalies: Secondary | ICD-10-CM

## 2018-01-24 DIAGNOSIS — O0990 Supervision of high risk pregnancy, unspecified, unspecified trimester: Secondary | ICD-10-CM

## 2018-01-24 DIAGNOSIS — Z3A12 12 weeks gestation of pregnancy: Secondary | ICD-10-CM

## 2018-01-24 DIAGNOSIS — O09521 Supervision of elderly multigravida, first trimester: Secondary | ICD-10-CM

## 2018-01-24 NOTE — Progress Notes (Signed)
Routine Prenatal Care Visit  Subjective  Hayley Obrien is a 42 y.o. G3P2002 at [redacted]w[redacted]d being seen today for ongoing prenatal care.  She is currently monitored for the following issues for this high-risk pregnancy and has CIN III (cervical intraepithelial neoplasia grade III) with severe dysplasia; Dysmenorrhea; Menorrhagia; ASCUS with positive high risk HPV cervical; Supervision of high risk pregnancy, antepartum; History of conization of cervix; and Supervision of elderly multigravida in first trimester on their problem list.  ----------------------------------------------------------------------------------- Patient reports no complaints.   Contractions: Not present. Vag. Bleeding: None.   . Denies leaking of fluid.  ----------------------------------------------------------------------------------- The following portions of the patient's history were reviewed and updated as appropriate: allergies, current medications, past family history, past medical history, past social history, past surgical history and problem list. Problem list updated.   Objective  Blood pressure 110/64, weight 147 lb (66.7 kg), last menstrual period 10/22/2017. Pregravid weight 150 lb (68 kg) Total Weight Gain -3 lb (-1.361 kg) Urinalysis: Urine Protein    Urine Glucose    Fetal Status: Fetal Heart Rate (bpm): 155         General:  Alert, oriented and cooperative. Patient is in no acute distress.  Skin: Skin is warm and dry. No rash noted.   Cardiovascular: Normal heart rate noted  Respiratory: Normal respiratory effort, no problems with respiration noted  Abdomen: Soft, gravid, appropriate for gestational age. Pain/Pressure: Absent     Pelvic:  Cervical exam deferred        Extremities: Normal range of motion.  Edema: None  Mental Status: Normal mood and affect. Normal behavior. Normal judgment and thought content.   Assessment   42 y.o. [id number] at [redacted]w[redacted]d by  08/06/2018, by Ultrasound presenting for routine  prenatal visit  Plan   Pregnancy #3 Problems (from 12/01/17 to present)    Problem Noted Resolved   Supervision of high risk pregnancy, antepartum 12/01/2017 by Conard Novak, MD No   Overview Signed 01/24/2018  2:26 PM by Conard Novak, MD    Clinic [facility] Prenatal Labs  Dating  Blood type:     Genetic Screen 1 Screen:    AFP:     Quad:     NIPS: Antibody:   Anatomic [place]  Rubella:   Varicella: @VZVIGG @  GTT Early:               Third trimester:  RPR:     Rhogam  HBsAg:     TDaP vaccine                       Flu Shot: HIV:     Baby Food                                GBS:   Contraception  Pap:  CBB     CS/VBAC    Support Person            History of conization of cervix 12/01/2017 by Conard Novak, MD No   Overview Addendum 12/27/2017  2:37 PM by Conard Novak, MD    LEEP and CKC in 2017 [ ]  u/s at 16 weeks for cervical length       Supervision of elderly multigravida in first trimester 12/01/2017 by Conard Novak, MD No       Preterm labor symptoms and general obstetric precautions including but not limited to vaginal bleeding, contractions, leaking of  fluid and fetal movement were reviewed in detail with the patient. Please refer to After Visit Summary for other counseling recommendations.   -NOB labs  - NIPT testing today - Cervical length at next visit  Return in about 4 weeks (around 02/21/2018) for u/s for cervix length and Routine Prenatal Appointment with Dr. Jean Rosenthal.  Thomasene Mohair, MD, [facility], [facility] 01/24/2018 2:40 PM

## 2018-01-26 LAB — RPR+RH+ABO+RUB AB+AB SCR+CB...
Antibody Screen: NEGATIVE
HEP B S AG: NEGATIVE
HIV SCREEN 4TH GENERATION: NONREACTIVE
Hematocrit: 37 % (ref 34.0–46.6)
Hemoglobin: 12.3 g/dL (ref 11.1–15.9)
MCH: 27.8 pg (ref 26.6–33.0)
MCHC: 33.2 g/dL (ref 31.5–35.7)
MCV: 84 fL (ref 79–97)
Platelets: 334 10*3/uL (ref 150–450)
RBC: 4.43 x10E6/uL (ref 3.77–5.28)
RDW: 13 % (ref 12.3–15.4)
RPR: NONREACTIVE
RUBELLA: 1.15 {index} (ref >0.99)
Rh Factor: POSITIVE
Varicella zoster IgG: 743 index (ref >165)
WBC: 10 10*3/uL (ref 3.4–10.8)

## 2018-01-26 LAB — HEMOGLOBINOPATHY EVALUATION
HEMOGLOBIN A2 QUANTITATION: 2.3 % (ref 1.8–3.2)
HGB A: 97.7 % (ref 96.4–98.8)
HGB C: 0 %
HGB S: 0 %
HGB VARIANT: 0 %
Hemoglobin F Quantitation: 0 % (ref 0.0–2.0)

## 2018-01-29 LAB — MATERNIT 21 PLUS CORE, BLOOD
Chromosome 13: NEGATIVE
Chromosome 18: NEGATIVE
Chromosome 21: NEGATIVE
Y Chromosome: NOT DETECTED

## 2018-02-21 ENCOUNTER — Ambulatory Visit (INDEPENDENT_AMBULATORY_CARE_PROVIDER_SITE_OTHER): Payer: Medicaid Other

## 2018-02-21 ENCOUNTER — Encounter: Payer: Self-pay | Admitting: Obstetrics and Gynecology

## 2018-02-21 ENCOUNTER — Ambulatory Visit (INDEPENDENT_AMBULATORY_CARE_PROVIDER_SITE_OTHER): Payer: Medicaid Other | Admitting: Obstetrics and Gynecology

## 2018-02-21 VITALS — BP 116/70 | Wt 150.0 lb

## 2018-02-21 DIAGNOSIS — O09522 Supervision of elderly multigravida, second trimester: Secondary | ICD-10-CM

## 2018-02-21 DIAGNOSIS — Z9889 Other specified postprocedural states: Secondary | ICD-10-CM

## 2018-02-21 DIAGNOSIS — O09529 Supervision of elderly multigravida, unspecified trimester: Secondary | ICD-10-CM

## 2018-02-21 DIAGNOSIS — Z3A16 16 weeks gestation of pregnancy: Secondary | ICD-10-CM

## 2018-02-21 DIAGNOSIS — O0990 Supervision of high risk pregnancy, unspecified, unspecified trimester: Secondary | ICD-10-CM

## 2018-02-21 DIAGNOSIS — Z362 Encounter for other antenatal screening follow-up: Secondary | ICD-10-CM

## 2018-02-21 LAB — POCT URINALYSIS DIPSTICK OB
Glucose, UA: NEGATIVE
PROTEIN: NEGATIVE

## 2018-02-21 NOTE — Progress Notes (Signed)
Routine Prenatal Care Visit  Subjective  Hayley Obrien is a 42 y.o. G3P2002 at [redacted]w[redacted]d being seen today for ongoing prenatal care.  She is currently monitored for the following issues for this high-risk pregnancy and has CIN III (cervical intraepithelial neoplasia grade III) with severe dysplasia; Dysmenorrhea; Menorrhagia; ASCUS with positive high risk HPV cervical; Supervision of high risk pregnancy, antepartum; History of conization of cervix; and Supervision of high-risk pregnancy of elderly multigravida on their problem list.  ----------------------------------------------------------------------------------- Patient reports no complaints.   Contractions: Not present. Vag. Bleeding: None.  Movement: Present. Denies leaking of fluid.  ----------------------------------------------------------------------------------- The following portions of the patient's history were reviewed and updated as appropriate: allergies, current medications, past family history, past medical history, past social history, past surgical history and problem list. Problem list updated.   Objective  Blood pressure 116/70, weight 150 lb (68 kg), last menstrual period 10/22/2017. Pregravid weight 150 lb (68 kg) Total Weight Gain 0 lb (0 kg) Urinalysis: Urine Protein Negative  Urine Glucose Negative  Fetal Status: Fetal Heart Rate (bpm): 150   Movement: Present     General:  Alert, oriented and cooperative. Patient is in no acute distress.  Skin: Skin is warm and dry. No rash noted.   Cardiovascular: Normal heart rate noted  Respiratory: Normal respiratory effort, no problems with respiration noted  Abdomen: Soft, gravid, appropriate for gestational age. Pain/Pressure: Absent     Pelvic:  Cervical exam deferred        Extremities: Normal range of motion.  Edema: None  Mental Status: Normal mood and affect. Normal behavior. Normal judgment and thought content.   Assessment   42 y.o. G3P2002 at [redacted]w[redacted]d by   08/06/2018, by Ultrasound presenting for routine prenatal visit  Plan   Pregnancy #3 Problems (from 12/01/17 to present)    Problem Noted Resolved   Supervision of high risk pregnancy, antepartum 12/01/2017 by Conard Novak, MD No   Overview Signed 01/24/2018  2:26 PM by Conard Novak, MD    [facility]inic [facility] Prenatal Labs  Dating  Blood type:     Genetic Screen 1 Screen:    AFP:     Quad:     NIPS: Antibody:   Anatomic [place]  Rubella:   Varicella: @VZVIGG @  GTT Early:               Third trimester:  RPR:     Rhogam  HBsAg:     TDaP vaccine                       Flu Shot: HIV:     Baby Food                                GBS:   Contraception  Pap:  CBB     CS/VBAC    Support Person            History of conization of cervix 12/01/2017 by Conard Novak, MD No   Overview Addendum 12/27/2017  2:37 PM by Conard Novak, MD    LEEP and CKC in 2017 [ ]  u/s at 16 weeks for cervical length       Supervision of high-risk pregnancy of elderly multigravida 12/01/2017 by Conard Novak, MD No       Preterm labor symptoms and general obstetric precautions including but not limited to vaginal bleeding, contractions, leaking of fluid and  fetal movement were reviewed in detail with the patient. Please refer to After Visit Summary for other counseling recommendations.   Return in about 3 weeks (around 03/14/2018) for Anatomy ultrasound and Routine Prenatal Appointment Dr. Jean Rosenthal.  Thomasene Mohair, MD, [facility], [facility] 02/21/2018 2:37 PM

## 2018-03-21 ENCOUNTER — Encounter: Payer: Self-pay | Admitting: Obstetrics and Gynecology

## 2018-03-21 ENCOUNTER — Ambulatory Visit (INDEPENDENT_AMBULATORY_CARE_PROVIDER_SITE_OTHER): Payer: Medicaid Other

## 2018-03-21 ENCOUNTER — Ambulatory Visit (INDEPENDENT_AMBULATORY_CARE_PROVIDER_SITE_OTHER): Payer: Medicaid Other | Admitting: Obstetrics and Gynecology

## 2018-03-21 VITALS — BP 118/74 | Wt 151.0 lb

## 2018-03-21 DIAGNOSIS — O09522 Supervision of elderly multigravida, second trimester: Secondary | ICD-10-CM

## 2018-03-21 DIAGNOSIS — Z3A20 20 weeks gestation of pregnancy: Secondary | ICD-10-CM

## 2018-03-21 DIAGNOSIS — Z363 Encounter for antenatal screening for malformations: Secondary | ICD-10-CM

## 2018-03-21 DIAGNOSIS — O0990 Supervision of high risk pregnancy, unspecified, unspecified trimester: Secondary | ICD-10-CM

## 2018-03-21 DIAGNOSIS — O09529 Supervision of elderly multigravida, unspecified trimester: Secondary | ICD-10-CM

## 2018-03-21 DIAGNOSIS — Z3A16 16 weeks gestation of pregnancy: Secondary | ICD-10-CM

## 2018-03-21 DIAGNOSIS — Z9889 Other specified postprocedural states: Secondary | ICD-10-CM

## 2018-03-21 MED ORDER — PRENATE PIXIE 10-0.6-0.4-200 MG PO CAPS: 1.0000 | ORAL_CAPSULE | Freq: Every day | ORAL | 11 refills | Status: DC

## 2018-03-21 NOTE — Progress Notes (Signed)
Routine Prenatal Care Visit  Subjective  Hayley Obrien is a 42 y.o. G3P2002 at [redacted]w[redacted]d being seen today for ongoing prenatal care.  She is currently monitored for the following issues for this high-risk pregnancy and has CIN III (cervical intraepithelial neoplasia grade III) with severe dysplasia; Dysmenorrhea; Menorrhagia; ASCUS with positive high risk HPV cervical; Supervision of high risk pregnancy, antepartum; History of conization of cervix; and Supervision of high-risk pregnancy of elderly multigravida on their problem list.  ----------------------------------------------------------------------------------- Patient reports occasional upper abdominal cramping that comes and goes. It is mild in severity. The pain does not radiate.  No alleviating or aggravating factors. She denies associated symptoms.  She also feels pelvic pressure and contraction-like activity in her uterus and this causes pain in her lower and mid back. She notes periods of constipation followed by periods of diarrhea.  She denies blood in her stool.  She denies blood in her urine and any urinary symptoms.  She denies vaginal symptoms of itching, burning, and irritation.  She denies nausea and vomiting.  She denies fevers and chills. Contractions: Not present. Vag. Bleeding: None.  Movement: Present. Denies leaking of fluid.  Ultrasound today: Normal anatomy apart from being incomplete for spinal views.  Cervical length was greater than 3 cm. ----------------------------------------------------------------------------------- The following portions of the patient's history were reviewed and updated as appropriate: allergies, current medications, past family history, past medical history, past social history, past surgical history and problem list. Problem list updated.   Objective  Blood pressure 118/74, weight 151 lb (68.5 kg), last menstrual period 10/22/2017. Pregravid weight 150 lb (68 kg) Total Weight Gain 1 lb (0.454  kg) Urinalysis: Urine Protein    Urine Glucose    Fetal Status: Fetal Heart Rate (bpm): Present   Movement: Present     General:  Alert, oriented and cooperative. Patient is in no acute distress.  Skin: Skin is warm and dry. No rash noted.   Cardiovascular: Normal heart rate noted  Respiratory: Normal respiratory effort, no problems with respiration noted  Abdomen: Soft, gravid, appropriate for gestational age. Pain/Pressure: Present, positive Murphy sign, no epigastric tenderness.  No uterine tenderness.  No CVA tenderness.  No tenderness along the paraspinous muscles and attachments to the iliac crest.  Pelvic:  Cervical exam deferred        Extremities: Normal range of motion.  Edema: None  Mental Status: Normal mood and affect. Normal behavior. Normal judgment and thought content.   Imaging Results: US Ob Comp + 14 Wk  Result Date: 03/21/2018 Patient Name: Hayley Obrien DOB: 26-May-1975 MRN: [id number] ULTRASOUND REPORT Location: [facility] Date of Service: 03/21/2018 Indications:Anatomy Ultrasound Findings: Mason Jim intrauterine pregnancy is visualized with FHR at 144 BPM. Biometrics give an (U/S) Gestational age of [redacted]w[redacted]d and an (U/S) EDD of 08/08/2018; this correlates with the clinically established Estimated Date of Delivery: 08/06/18 Fetal presentation is Breech. EFW: 335g (12oz). Placenta: posterior. Grade: 0 AFI: subjectively normal. Anatomic survey is incomplete for fetal spine due to position; Gender - female.  Right Ovary is normal in appearance. Left Ovary is normal appearance. Survey of the adnexa demonstrates no adnexal masses. There is no free peritoneal fluid in the cul de sac. Questionable small fibroid seen 1.8cm Impression: 1. [redacted]w[redacted]d Viable Singleton Intrauterine pregnancy by U/S. 2. (U/S) EDD is consistent with Clinically established Estimated Date of Delivery: 08/06/18 . 3. Follow up fetal spine Abby Vanessa Grafton, RDMS RVT There is a singleton gestation with subjectively normal  amniotic fluid volume. The fetal biometry correlates  with established dating. Detailed evaluation of the fetal anatomy was performed.The fetal anatomical survey appears within normal limits within the resolution of ultrasound as described above.  Not all structures were able to be visualized on today's study.  It is recommended that a follow-up ultrasound be performed to complete visualization of the unobserved structures today.  It must be noted that a normal ultrasound is unable to rule out fetal aneuploidy nor is it able to detect all possible malformations.    The ultrasound images and findings were reviewed by me and I agree with the above report. Thomasene Mohair, MD, [facility], [facility] 03/21/2018 5:18 PM       Assessment   42 y.o. [id number] at [redacted]w[redacted]d by  08/06/2018, by Ultrasound presenting for routine prenatal visit  Plan   Pregnancy #3 Problems (from 12/01/17 to present)    Problem Noted Resolved   Supervision of high risk pregnancy, antepartum 12/01/2017 by Conard Novak, MD No   Overview Signed 01/24/2018  2:26 PM by Conard Novak, MD    Clinic [facility] Prenatal Labs  Dating  Blood type:     Genetic Screen 1 Screen:    AFP:     Quad:     NIPS: Antibody:   Anatomic [place]  Rubella:   Varicella: @VZVIGG @  GTT Early:               Third trimester:  RPR:     Rhogam  HBsAg:     TDaP vaccine                       Flu Shot: HIV:     Baby Food                                GBS:   Contraception  Pap:  CBB     CS/VBAC    Support Person          History of conization of cervix 12/01/2017 by Conard Novak, MD No   Overview Addendum 12/27/2017  2:37 PM by Conard Novak, MD    LEEP and CKC in 2017 [ ]  u/s at 16 weeks for cervical length       Supervision of high-risk pregnancy of elderly multigravida 12/01/2017 by Conard Novak, MD No       Preterm labor symptoms and general obstetric precautions including but not limited to vaginal  bleeding, contractions, leaking of fluid and fetal movement were reviewed in detail with the patient. Please refer to After Visit Summary for other counseling recommendations.   -Precautions for various etiologies of abdominal cramping and pain discussed.  Given her positive Eulah Pont sign, she was given precautions regarding worsening gallbladder symptoms.  She was also given precautions regarding the development of worsening symptoms in her bladder and vagina.  Follow-up in 2 weeks to complete her anatomy scan.  Return in about 2 weeks (around 04/04/2018) for u/s for completion anatomy and routine prenatal.  Thomasene Mohair, MD, [facility], [facility] 03/21/2018 6:06 PM

## 2018-04-04 ENCOUNTER — Ambulatory Visit (INDEPENDENT_AMBULATORY_CARE_PROVIDER_SITE_OTHER): Payer: Medicaid Other

## 2018-04-04 ENCOUNTER — Ambulatory Visit (INDEPENDENT_AMBULATORY_CARE_PROVIDER_SITE_OTHER): Payer: Medicaid Other | Admitting: Obstetrics and Gynecology

## 2018-04-04 ENCOUNTER — Encounter: Payer: Self-pay | Admitting: Obstetrics and Gynecology

## 2018-04-04 VITALS — BP 122/74 | Wt 152.0 lb

## 2018-04-04 DIAGNOSIS — Z9889 Other specified postprocedural states: Secondary | ICD-10-CM

## 2018-04-04 DIAGNOSIS — O0990 Supervision of high risk pregnancy, unspecified, unspecified trimester: Secondary | ICD-10-CM

## 2018-04-04 DIAGNOSIS — Z362 Encounter for other antenatal screening follow-up: Secondary | ICD-10-CM

## 2018-04-04 DIAGNOSIS — O09529 Supervision of elderly multigravida, unspecified trimester: Secondary | ICD-10-CM

## 2018-04-04 DIAGNOSIS — O09522 Supervision of elderly multigravida, second trimester: Secondary | ICD-10-CM

## 2018-04-04 DIAGNOSIS — Z3A22 22 weeks gestation of pregnancy: Secondary | ICD-10-CM

## 2018-04-04 LAB — POCT URINALYSIS DIPSTICK OB
Glucose, UA: NEGATIVE
PROTEIN: NEGATIVE

## 2018-04-04 NOTE — Progress Notes (Signed)
Routine Prenatal Care Visit  Subjective  Hayley Obrien is a 42 y.o. G3P2002 at [redacted]w[redacted]d being seen today for ongoing prenatal care.  She is currently monitored for the following issues for this high-risk pregnancy and has CIN III (cervical intraepithelial neoplasia grade III) with severe dysplasia; Dysmenorrhea; Menorrhagia; ASCUS with positive high risk HPV cervical; Supervision of high risk pregnancy, antepartum; History of conization of cervix; and Supervision of high-risk pregnancy of elderly multigravida on their problem list.  ----------------------------------------------------------------------------------- Patient reports no complaints.   Contractions: Not present. Vag. Bleeding: None.  Movement: Present. Denies leaking of fluid.  U/s complete for spinal views ----------------------------------------------------------------------------------- The following portions of the patient's history were reviewed and updated as appropriate: allergies, current medications, past family history, past medical history, past social history, past surgical history and problem list. Problem list updated.   Objective  Blood pressure 122/74, weight 152 lb (68.9 kg), last menstrual period 10/22/2017. Pregravid weight 150 lb (68 kg) Total Weight Gain 2 lb (0.907 kg) Urinalysis: Urine Protein Negative  Urine Glucose Negative  Fetal Status: Fetal Heart Rate (bpm): present   Movement: Present     General:  Alert, oriented and cooperative. Patient is in no acute distress.  Skin: Skin is warm and dry. No rash noted.   Cardiovascular: Normal heart rate noted  Respiratory: Normal respiratory effort, no problems with respiration noted  Abdomen: Soft, gravid, appropriate for gestational age. Pain/Pressure: Absent     Pelvic:  Cervical exam deferred        Extremities: Normal range of motion.  Edema: None  Mental Status: Normal mood and affect. Normal behavior. Normal judgment and thought content.   Imaging  Report: [place] Ob Follow Up  Result Date: 04/04/2018 Patient Name: Hayley Kapuroelia Doyon DOB: 12/08/1975 MRN: [id number] ULTRASOUND REPORT Location: [facility] Date of Service: 04/04/2018 Indications: Anatomy follow up ultrasound Findings: Mason JimSingleton intrauterine pregnancy is visualized with FHR at 158 BPM. Fetal presentation is Cephalic. Placenta: posterior. Grade: 0 AFI: subjectively normal. Anatomic survey is complete for fetal spine. There is no free peritoneal fluid in the cul de sac. Impression: 1. [redacted]w[redacted]d Viable Singleton Intrauterine pregnancy previously established criteria. 2. Normal Anatomy Scan is now complete Darlina GuysAbby M Clarke, RDMS RVT There is a singleton gestation with subjectively normal amniotic fluid volume. Detailed evaluation of the fetal anatomy was performed. The fetal anatomical survey appears within normal limits within the resolution of ultrasound as described above.  Not all structures were attempted to be visualized as they were noted on a previous exam. See the prior exam for full details.  It must be noted that a normal ultrasound is unable to rule out fetal aneuploidy nor is it able to detect all possible malformations.  The ultrasound images and findings were reviewed by me and I agree with the above report. Thomasene MohairStephen Porchea Charrier, MD, Merlinda FrederickFACOG [facility] 04/04/2018 4:53 PM       Assessment   42 y.o. [id number] at [redacted]w[redacted]d by  08/06/2018, by Ultrasound presenting for routine prenatal visit  Plan   Pregnancy #3 Problems (from 12/01/17 to present)    Problem Noted Resolved   Supervision of high risk pregnancy, antepartum 12/01/2017 by Conard NovakJackson, Kirti Carl D, MD No   Overview Signed 01/24/2018  2:26 PM by Conard NovakJackson, Edwardo Wojnarowski D, MD    Clinic [facility] Prenatal Labs  Dating  Blood type:     Genetic Screen 1 Screen:    AFP:     Quad:     NIPS: Antibody:   Anatomic  [place]  Rubella:   Varicella: @VZVIGG @  GTT Early:               Third trimester:  RPR:     Rhogam  HBsAg:     TDaP  vaccine                       Flu Shot: HIV:     Baby Food                                GBS:   Contraception  Pap:  CBB     CS/VBAC    Support Person         History of conization of cervix 12/01/2017 by Conard Novak, MD No   Overview Addendum 12/27/2017  2:37 PM by Conard Novak, MD    LEEP and CKC in 2017 [ ]  u/s at 16 weeks for cervical length       Supervision of high-risk pregnancy of elderly multigravida 12/01/2017 by Conard Novak, MD No       Preterm labor symptoms and general obstetric precautions including but not limited to vaginal bleeding, contractions, leaking of fluid and fetal movement were reviewed in detail with the patient. Please refer to After Visit Summary for other counseling recommendations.   Return in about 4 weeks (around 05/02/2018) for Routine Prenatal Appointment.  Thomasene Mohair, MD, [facility], [facility] 04/04/2018 4:58 PM

## 2018-05-01 ENCOUNTER — Encounter: Payer: Self-pay | Admitting: Obstetrics and Gynecology

## 2018-05-01 ENCOUNTER — Other Ambulatory Visit: Payer: Self-pay

## 2018-05-01 ENCOUNTER — Ambulatory Visit (INDEPENDENT_AMBULATORY_CARE_PROVIDER_SITE_OTHER): Payer: Medicaid Other | Admitting: Obstetrics and Gynecology

## 2018-05-01 VITALS — BP 98/58 | Wt 157.0 lb

## 2018-05-01 DIAGNOSIS — Z113 Encounter for screening for infections with a predominantly sexual mode of transmission: Secondary | ICD-10-CM

## 2018-05-01 DIAGNOSIS — Z3A26 26 weeks gestation of pregnancy: Secondary | ICD-10-CM

## 2018-05-01 DIAGNOSIS — Z131 Encounter for screening for diabetes mellitus: Secondary | ICD-10-CM

## 2018-05-01 DIAGNOSIS — R059 Cough, unspecified: Secondary | ICD-10-CM

## 2018-05-01 DIAGNOSIS — O09529 Supervision of elderly multigravida, unspecified trimester: Secondary | ICD-10-CM

## 2018-05-01 DIAGNOSIS — O09523 Supervision of elderly multigravida, third trimester: Secondary | ICD-10-CM

## 2018-05-01 DIAGNOSIS — Z9889 Other specified postprocedural states: Secondary | ICD-10-CM

## 2018-05-01 DIAGNOSIS — R05 Cough: Secondary | ICD-10-CM

## 2018-05-01 DIAGNOSIS — O0990 Supervision of high risk pregnancy, unspecified, unspecified trimester: Secondary | ICD-10-CM

## 2018-05-01 LAB — POCT URINALYSIS DIPSTICK OB
Glucose, UA: NEGATIVE
POC,PROTEIN,UA: NEGATIVE

## 2018-05-01 MED ORDER — ALBUTEROL SULFATE HFA 108 (90 BASE) MCG/ACT IN AERS: 2.0000 | INHALATION_SPRAY | Freq: Four times a day (QID) | RESPIRATORY_TRACT | 0 refills | Status: DC | PRN

## 2018-05-01 MED ORDER — HYDROCOD POLST-CPM POLST ER 10-8 MG/5ML PO SUER: 5.0000 mL | Freq: Two times a day (BID) | ORAL | 0 refills | Status: AC | PRN

## 2018-05-01 NOTE — Progress Notes (Signed)
Routine Prenatal Care Visit  Subjective  Hayley Obrien is a 42 y.o. G3P2002 at [redacted]w[redacted]d being seen today for ongoing prenatal care.  She is currently monitored for the following issues for this high-risk pregnancy and has CIN III (cervical intraepithelial neoplasia grade III) with severe dysplasia; Dysmenorrhea; Menorrhagia; ASCUS with positive high risk HPV cervical; Supervision of high risk pregnancy, antepartum; History of conization of cervix; and Supervision of high-risk pregnancy of elderly multigravida on their problem list.  ----------------------------------------------------------------------------------- Patient reports cough x 3 weeks. No relief w/OTC Robitussin w/honey.  Denies fevers, though she has had cold sweats. She has not taken her temperature at home. BUt, does not believe she has had a fever. Denies shortness of breath and sick contacts. She has not been tested for influenza.  Contractions: Not present. Vag. Bleeding: None.  Movement: Present. Denies leaking of fluid.  ----------------------------------------------------------------------------------- The following portions of the patient's history were reviewed and updated as appropriate: allergies, current medications, past family history, past medical history, past social history, past surgical history and problem list. Problem list updated.   Objective  Blood pressure (!) 98/58, weight 157 lb (71.2 kg), last menstrual period 10/22/2017. Pregravid weight 150 lb (68 kg) Total Weight Gain 7 lb (3.175 kg) Urinalysis: Urine Protein Negative  Urine Glucose Negative  Fetal Status: Fetal Heart Rate (bpm): 140 Fundal Height: 26 cm Movement: Present     General:  Alert, oriented and cooperative. Patient is in no acute distress.  Skin: Skin is warm and dry. No rash noted.   Cardiovascular: Normal heart rate noted  Respiratory: Normal respiratory effort, no problems with respiration noted, mild wheezing throughout  Abdomen: Soft,  gravid, appropriate for gestational age. Pain/Pressure: Absent     Pelvic:  Cervical exam deferred        Extremities: Normal range of motion.  Edema: None  Mental Status: Normal mood and affect. Normal behavior. Normal judgment and thought content.   Assessment   42 y.o. G3P2002 at [redacted]w[redacted]d by  08/06/2018, by Ultrasound presenting for routine prenatal visit  Plan   Pregnancy #3 Problems (from 12/01/17 to present)    Problem Noted Resolved   Supervision of high risk pregnancy, antepartum 12/01/2017 by Conard Novak, MD No   Overview Signed 01/24/2018  2:26 PM by Conard Novak, MD    Clinic [facility] Prenatal Labs  Dating  Blood type:     Genetic Screen 1 Screen:    AFP:     Quad:     NIPS: Antibody:   Anatomic [place]  Rubella:   Varicella: @VZVIGG @  GTT Early:               Third trimester:  RPR:     Rhogam  HBsAg:     TDaP vaccine                       Flu Shot: HIV:     Baby Food                                GBS:   Contraception  Pap:  CBB     CS/VBAC    Support Person            History of conization of cervix 12/01/2017 by Conard Novak, MD No   Overview Addendum 12/27/2017  2:37 PM by Conard Novak, MD    LEEP and CKC in 2017 [ ]   u/s at 16 weeks for cervical length       Supervision of high-risk pregnancy of elderly multigravida 12/01/2017 by Conard NovakJackson, Akon Reinoso D, MD No       Preterm labor symptoms and general obstetric precautions including but not limited to vaginal bleeding, contractions, leaking of fluid and fetal movement were reviewed in detail with the patient. Please refer to After Visit Summary for other counseling recommendations.   - Cough: Tylenol for fever. If sx persist, I recommend she go to Urgent Care for influenza testing and possible treatment for bacterial bronchitis.  Will prescribe Tussionex and albuterol for symptoms right now.   Return in about 2 weeks (around 05/15/2018) for 28 week labs, routine prenatal appt.  Thomasene MohairStephen Loy Mccartt,  MD, Merlinda FrederickFACOG [facility], [facility] 05/01/2018 12:23 PM

## 2018-05-15 ENCOUNTER — Ambulatory Visit (INDEPENDENT_AMBULATORY_CARE_PROVIDER_SITE_OTHER): Payer: Medicaid Other | Admitting: Obstetrics and Gynecology

## 2018-05-15 ENCOUNTER — Encounter: Payer: Self-pay | Admitting: Obstetrics and Gynecology

## 2018-05-15 ENCOUNTER — Encounter: Payer: Medicaid Other | Admitting: Obstetrics and Gynecology

## 2018-05-15 ENCOUNTER — Other Ambulatory Visit: Payer: Medicaid Other

## 2018-05-15 VITALS — BP 110/58 | Wt 158.0 lb

## 2018-05-15 DIAGNOSIS — Z113 Encounter for screening for infections with a predominantly sexual mode of transmission: Secondary | ICD-10-CM

## 2018-05-15 DIAGNOSIS — O0990 Supervision of high risk pregnancy, unspecified, unspecified trimester: Secondary | ICD-10-CM

## 2018-05-15 DIAGNOSIS — O09529 Supervision of elderly multigravida, unspecified trimester: Secondary | ICD-10-CM

## 2018-05-15 DIAGNOSIS — Z3A28 28 weeks gestation of pregnancy: Secondary | ICD-10-CM

## 2018-05-15 DIAGNOSIS — O219 Vomiting of pregnancy, unspecified: Secondary | ICD-10-CM

## 2018-05-15 DIAGNOSIS — Z131 Encounter for screening for diabetes mellitus: Secondary | ICD-10-CM

## 2018-05-15 MED ORDER — DOCUSATE SODIUM 100 MG PO CAPS: 100.0000 mg | ORAL_CAPSULE | Freq: Two times a day (BID) | ORAL | 2 refills | Status: DC | PRN

## 2018-05-15 MED ORDER — ONDANSETRON 4 MG PO TBDP: 4.0000 mg | ORAL_TABLET | Freq: Three times a day (TID) | ORAL | 0 refills | Status: DC | PRN

## 2018-05-15 NOTE — Progress Notes (Signed)
Routine Prenatal Care Visit  Subjective  Hayley Obrien is a 42 y.o. G3P2002 at 7338w1d being seen today for ongoing prenatal care.  She is currently monitored for the following issues for this high-risk pregnancy and has CIN III (cervical intraepithelial neoplasia grade III) with severe dysplasia; Dysmenorrhea; Menorrhagia; ASCUS with positive high risk HPV cervical; Supervision of high risk pregnancy, antepartum; History of conization of cervix; and Supervision of high-risk pregnancy of elderly multigravida on their problem list.  ----------------------------------------------------------------------------------- Patient reports cold symptoms.  Complaints of sinus pressure for more than a month with difficulty sleeping at night because of this. Took cold medication which helped her feel better but does not tak allergy medication even though she has allergies. She also since Friday has had vomiting after eating solid foods. Reports she threw up yesterday after eating pizza. Tolerated toast. Encouraged a bland diet until she is feeling better.  Contractions: Not present. Vag. Bleeding: None.  Movement: Present. Denies leaking of fluid.  ----------------------------------------------------------------------------------- The following portions of the patient's history were reviewed and updated as appropriate: allergies, current medications, past family history, past medical history, past social history, past surgical history and problem list. Problem list updated.   Objective  Blood pressure (!) 110/58, weight 158 lb (71.7 kg), last menstrual period 10/22/2017. Pregravid weight 150 lb (68 kg) Total Weight Gain 8 lb (3.629 kg) Urinalysis:      Fetal Status: Fetal Heart Rate (bpm): 140 Fundal Height: 28 cm Movement: Present     General:  Alert, oriented and cooperative. Patient is in no acute distress.  Skin: Skin is warm and dry. No rash noted.   Cardiovascular: Normal heart rate noted    Respiratory: Normal respiratory effort, no problems with respiration noted  Abdomen: Soft, gravid, appropriate for gestational age. Pain/Pressure: Absent     Pelvic:  Cervical exam deferred        Extremities: Normal range of motion.     Mental Status: Normal mood and affect. Normal behavior. Normal judgment and thought content.     Assessment   42 y.o. G3P2002 at 3238w1d by  08/06/2018, by Ultrasound presenting for routine prenatal visit  Plan   Pregnancy #3 Problems (from 12/01/17 to present)    Problem Noted Resolved   Supervision of high risk pregnancy, antepartum 12/01/2017 by Conard NovakJackson, Stephen D, MD No   Overview Addendum 05/15/2018 11:17 AM by Natale MilchSchuman,  R, MD    Clinic [facility] Prenatal Labs  Dating  8wk US Blood type: O/Positive/-- (09/10 1501)   Genetic Screen NIPS: normal xx Antibody:Negative (09/10 1501)  Anatomic US  complete Rubella: 1.15 (09/10 1501) Varicella:   GTT Early:               Third trimester:  RPR: Non Reactive (09/10 1501)   Rhogam  not needed HBsAg: Negative (09/10 1501)   TDaP vaccine                       Flu Shot: HIV: Non Reactive (09/10 1501)   Baby Food                                GBS:   Contraception  Pap: NIL 2019  CBB     CS/VBAC    Support Person            History of conization of cervix 12/01/2017 by Conard NovakJackson, Stephen D, MD No   Overview  Addendum 12/27/2017  2:37 PM by Conard NovakJackson, Stephen D, MD    LEEP and CKC in 2017 [ ]  u/s at 16 weeks for cervical length       Supervision of high-risk pregnancy of elderly multigravida 12/01/2017 by Conard NovakJackson, Stephen D, MD No       Gestational age appropriate obstetric precautions including but not limited to vaginal bleeding, contractions, leaking of fluid and fetal movement were reviewed in detail with the patient.    Discussed starting daily allergy medication such as claritin and flonase. Told she can also take Mucinex for congestion and will send prescription for zofran as needed.      Return in about 2 weeks (around 05/29/2018) for ROB.  Natale Milchhristanna R  MD [facility], [facility] 05/15/2018, 11:36 AM

## 2018-05-15 NOTE — Progress Notes (Signed)
ROB/GTT C/o chest congestion, some SOB,good FM, denies lof.

## 2018-05-16 LAB — 28 WEEK RH+PANEL
Basophils Absolute: 0 10*3/uL (ref 0.0–0.2)
Basos: 1 %
EOS (ABSOLUTE): 0.1 10*3/uL (ref 0.0–0.4)
Eos: 1 %
GESTATIONAL DIABETES SCREEN: 128 mg/dL (ref 65–139)
HIV Screen 4th Generation wRfx: NONREACTIVE
Hematocrit: 33.6 % — ABNORMAL LOW (ref 34.0–46.6)
Hemoglobin: 11.3 g/dL (ref 11.1–15.9)
Immature Grans (Abs): 0 10*3/uL (ref 0.0–0.1)
Immature Granulocytes: 1 %
Lymphocytes Absolute: 1.7 10*3/uL (ref 0.7–3.1)
Lymphs: 26 %
MCH: 27.9 pg (ref 26.6–33.0)
MCHC: 33.6 g/dL (ref 31.5–35.7)
MCV: 83 fL (ref 79–97)
Monocytes Absolute: 0.6 10*3/uL (ref 0.1–0.9)
Monocytes: 8 %
NEUTROS ABS: 4.3 10*3/uL (ref 1.4–7.0)
Neutrophils: 63 %
Platelets: 323 10*3/uL (ref 150–450)
RBC: 4.05 x10E6/uL (ref 3.77–5.28)
RDW: 12.3 % (ref 12.3–15.4)
RPR Ser Ql: NONREACTIVE
WBC: 6.7 10*3/uL (ref 3.4–10.8)

## 2018-05-17 NOTE — L&D Delivery Note (Signed)
Delivery Note Primary OB: [facility] Delivery Physician: Annamarie Major, MD Gestational Age: Full term Antepartum complications: none Intrapartum complications: Meconium.  A viable Female was delivered via vertex perentation.  Apgars:8 ,9  Weight:  pending .   Placenta status: spontaneous and Intact.  Cord: 3+ vessels;  with the following complications: none.  Anesthesia:  none Episiotomy:  none Lacerations:  2nd Suture Repair: 2.0 vicryl Est. Blood Loss (mL):  less than 100 mL  Mom to postpartum.  Baby to Couplet care / Skin to Skin.  Annamarie Major, MD, [facility], [facility] 07/26/2018  8:22 AM [phone]

## 2018-06-02 ENCOUNTER — Encounter: Payer: Self-pay | Admitting: Obstetrics and Gynecology

## 2018-06-02 ENCOUNTER — Ambulatory Visit (INDEPENDENT_AMBULATORY_CARE_PROVIDER_SITE_OTHER): Payer: Medicaid Other | Admitting: Obstetrics and Gynecology

## 2018-06-02 VITALS — BP 90/58 | Wt 164.0 lb

## 2018-06-02 DIAGNOSIS — Z9889 Other specified postprocedural states: Secondary | ICD-10-CM

## 2018-06-02 DIAGNOSIS — O09529 Supervision of elderly multigravida, unspecified trimester: Secondary | ICD-10-CM

## 2018-06-02 DIAGNOSIS — O0990 Supervision of high risk pregnancy, unspecified, unspecified trimester: Secondary | ICD-10-CM

## 2018-06-02 DIAGNOSIS — Z23 Encounter for immunization: Secondary | ICD-10-CM

## 2018-06-02 DIAGNOSIS — Z3A30 30 weeks gestation of pregnancy: Secondary | ICD-10-CM

## 2018-06-02 DIAGNOSIS — O09523 Supervision of elderly multigravida, third trimester: Secondary | ICD-10-CM

## 2018-06-02 LAB — POCT URINALYSIS DIPSTICK OB
Glucose, UA: NEGATIVE
POC,PROTEIN,UA: NEGATIVE

## 2018-06-02 MED ORDER — PROVIDA DHA 16-16-1.25-110 MG PO CAPS: 1.0000 | ORAL_CAPSULE | Freq: Every day | ORAL | 8 refills | Status: DC

## 2018-06-02 NOTE — Progress Notes (Signed)
Routine Prenatal Care Visit  Subjective  Hayley Obrien is a 43 y.o. G3P2002 at [redacted]w[redacted]d being seen today for ongoing prenatal care.  She is currently monitored for the following issues for this high-risk pregnancy and has CIN III (cervical intraepithelial neoplasia grade III) with severe dysplasia; Dysmenorrhea; Menorrhagia; ASCUS with positive high risk HPV cervical; Supervision of high risk pregnancy, antepartum; History of conization of cervix; and Supervision of high-risk pregnancy of elderly multigravida on their problem list.  ----------------------------------------------------------------------------------- Patient reports no complaints.   Contractions: Not present. Vag. Bleeding: None.  Movement: Present. Denies leaking of fluid.  ----------------------------------------------------------------------------------- The following portions of the patient's history were reviewed and updated as appropriate: allergies, current medications, past family history, past medical history, past social history, past surgical history and problem list. Problem list updated.   Objective  Blood pressure (!) 90/58, weight 164 lb (74.4 kg), last menstrual period 10/22/2017. Pregravid weight 150 lb (68 kg) Total Weight Gain 14 lb (6.35 kg) Urinalysis: Urine Protein Negative  Urine Glucose Negative  Fetal Status: Fetal Heart Rate (bpm): 150 Fundal Height: 30 cm Movement: Present     General:  Alert, oriented and cooperative. Patient is in no acute distress.  Skin: Skin is warm and dry. No rash noted.   Cardiovascular: Normal heart rate noted  Respiratory: Normal respiratory effort, no problems with respiration noted  Abdomen: Soft, gravid, appropriate for gestational age. Pain/Pressure: Absent     Pelvic:  Cervical exam deferred        Extremities: Normal range of motion.  Edema: None  Mental Status: Normal mood and affect. Normal behavior. Normal judgment and thought content.   Assessment   43 y.o.  G3P2002 at [redacted]w[redacted]d by  08/06/2018, by Ultrasound presenting for routine prenatal visit  Plan   Pregnancy #3 Problems (from 12/01/17 to present)    Problem Noted Resolved   Supervision of high risk pregnancy, antepartum 12/01/2017 by Conard Novak, MD No   Overview Addendum 05/15/2018 11:17 AM by Natale Milch, MD    Clinic [facility] Prenatal Labs  Dating  8wk [place] Blood type: O/Positive/-- (09/10 1501)   Genetic Screen NIPS: normal xx Antibody:Negative (09/10 1501)  Anatomic [place]  complete Rubella: 1.15 (09/10 1501) Varicella:   GTT Early:               Third trimester:  RPR: Non Reactive (09/10 1501)   Rhogam  not needed HBsAg: Negative (09/10 1501)   TDaP vaccine                       Flu Shot: HIV: Non Reactive (09/10 1501)   Baby Food                                GBS:   Contraception  Pap: NIL 2019  CBB     CS/VBAC    Support Person            History of conization of cervix 12/01/2017 by Conard Novak, MD No   Overview Addendum 12/27/2017  2:37 PM by Conard Novak, MD    LEEP and CKC in 2017 [ ]  u/s at 16 weeks for cervical length       Supervision of high-risk pregnancy of elderly multigravida 12/01/2017 by Conard Novak, MD No       Preterm labor symptoms and general obstetric precautions including but not limited to vaginal bleeding, contractions,  leaking of fluid and fetal movement were reviewed in detail with the patient. Please refer to After Visit Summary for other counseling recommendations.   - plans to breastfeed - considering different options for contraception - u/s at 34-36 weeks for growth  Return in about 2 weeks (around 06/16/2018) for Routine Prenatal Appointment with Dr. Jean Rosenthal.  Thomasene Mohair, MD, [facility], [facility] 06/02/2018 11:38 AM

## 2018-06-19 ENCOUNTER — Encounter: Payer: Self-pay | Admitting: Obstetrics and Gynecology

## 2018-06-19 ENCOUNTER — Ambulatory Visit (INDEPENDENT_AMBULATORY_CARE_PROVIDER_SITE_OTHER): Payer: Medicaid Other | Admitting: Obstetrics and Gynecology

## 2018-06-19 VITALS — BP 110/64 | Wt 166.0 lb

## 2018-06-19 DIAGNOSIS — Z9889 Other specified postprocedural states: Secondary | ICD-10-CM

## 2018-06-19 DIAGNOSIS — O0990 Supervision of high risk pregnancy, unspecified, unspecified trimester: Secondary | ICD-10-CM

## 2018-06-19 DIAGNOSIS — O09523 Supervision of elderly multigravida, third trimester: Secondary | ICD-10-CM

## 2018-06-19 DIAGNOSIS — O0993 Supervision of high risk pregnancy, unspecified, third trimester: Secondary | ICD-10-CM

## 2018-06-19 DIAGNOSIS — Z3A33 33 weeks gestation of pregnancy: Secondary | ICD-10-CM

## 2018-06-19 DIAGNOSIS — O09529 Supervision of elderly multigravida, unspecified trimester: Secondary | ICD-10-CM

## 2018-06-19 NOTE — Progress Notes (Signed)
Routine Prenatal Care Visit  Subjective  Hayley Obrien is a 43 y.o. G3P2002 at [redacted]w[redacted]d being seen today for ongoing prenatal care.  She is currently monitored for the following issues for this high-risk pregnancy and has CIN III (cervical intraepithelial neoplasia grade III) with severe dysplasia; Dysmenorrhea; Menorrhagia; ASCUS with positive high risk HPV cervical; Supervision of high risk pregnancy, antepartum; History of conization of cervix; and Supervision of high-risk pregnancy of elderly multigravida on their problem list.  ----------------------------------------------------------------------------------- Patient reports no complaints.   Contractions: Not present. Vag. Bleeding: None.  Movement: Present. Denies leaking of fluid.  ----------------------------------------------------------------------------------- The following portions of the patient's history were reviewed and updated as appropriate: allergies, current medications, past family history, past medical history, past social history, past surgical history and problem list. Problem list updated.   Objective  Blood pressure 110/64, weight 166 lb (75.3 kg), last menstrual period 10/22/2017. Pregravid weight 150 lb (68 kg) Total Weight Gain 16 lb (7.258 kg) Urinalysis: Urine Protein    Urine Glucose    Fetal Status: Fetal Heart Rate (bpm): 150 Fundal Height: 34 cm Movement: Present  Presentation: Vertex  General:  Alert, oriented and cooperative. Patient is in no acute distress.  Skin: Skin is warm and dry. No rash noted.   Cardiovascular: Normal heart rate noted  Respiratory: Normal respiratory effort, no problems with respiration noted  Abdomen: Soft, gravid, appropriate for gestational age. Pain/Pressure: Absent     Pelvic:  Cervical exam deferred        Extremities: Normal range of motion.  Edema: None  Mental Status: Normal mood and affect. Normal behavior. Normal judgment and thought content.   Assessment   43 y.o.  G3P2002 at [redacted]w[redacted]d by  08/06/2018, by Ultrasound presenting for routine prenatal visit  Plan   Pregnancy #3 Problems (from 12/01/17 to present)    Problem Noted Resolved   Supervision of high risk pregnancy, antepartum 12/01/2017 by Conard Novak, MD No   Overview Addendum 05/15/2018 11:17 AM by Natale Milch, MD    Clinic [facility] Prenatal Labs  Dating  8wk [place] Blood type: O/Positive/-- (09/10 1501)   Genetic Screen NIPS: normal xx Antibody:Negative (09/10 1501)  Anatomic [place]  complete Rubella: 1.15 (09/10 1501) Varicella:   GTT Early:               Third trimester:  RPR: Non Reactive (09/10 1501)   Rhogam  not needed HBsAg: Negative (09/10 1501)   TDaP vaccine                       Flu Shot: HIV: Non Reactive (09/10 1501)   Baby Food                                GBS:   Contraception  Pap: NIL 2019  CBB     CS/VBAC    Support Person          History of conization of cervix 12/01/2017 by Conard Novak, MD No   Overview Addendum 12/27/2017  2:37 PM by Conard Novak, MD    LEEP and CKC in 2017 [x]  u/s at 16 weeks for cervical length       Supervision of high-risk pregnancy of elderly multigravida 12/01/2017 by Conard Novak, MD No      Preterm labor symptoms and general obstetric precautions including but not limited to vaginal bleeding, contractions, leaking of fluid  and fetal movement were reviewed in detail with the patient. Please refer to After Visit Summary for other counseling recommendations.   Return in about 1 week (around 06/26/2018) for U/S for growth/afi and routine prenatal with Dr. Jean Rosenthal.  Thomasene Mohair, MD, [facility], [facility] 06/19/2018 12:11 PM

## 2018-06-30 ENCOUNTER — Ambulatory Visit (INDEPENDENT_AMBULATORY_CARE_PROVIDER_SITE_OTHER): Payer: Medicaid Other | Admitting: Obstetrics and Gynecology

## 2018-06-30 ENCOUNTER — Ambulatory Visit (INDEPENDENT_AMBULATORY_CARE_PROVIDER_SITE_OTHER): Payer: Medicaid Other

## 2018-06-30 ENCOUNTER — Encounter: Payer: Self-pay | Admitting: Obstetrics and Gynecology

## 2018-06-30 VITALS — BP 112/64 | Wt 166.0 lb

## 2018-06-30 DIAGNOSIS — Z3A34 34 weeks gestation of pregnancy: Secondary | ICD-10-CM

## 2018-06-30 DIAGNOSIS — O09523 Supervision of elderly multigravida, third trimester: Secondary | ICD-10-CM | POA: Diagnosis not present

## 2018-06-30 DIAGNOSIS — Z9889 Other specified postprocedural states: Secondary | ICD-10-CM

## 2018-06-30 DIAGNOSIS — O0990 Supervision of high risk pregnancy, unspecified, unspecified trimester: Secondary | ICD-10-CM

## 2018-06-30 DIAGNOSIS — O09529 Supervision of elderly multigravida, unspecified trimester: Secondary | ICD-10-CM

## 2018-06-30 NOTE — Progress Notes (Signed)
Routine Prenatal Care Visit  Subjective  Hayley Obrien is a 43 y.o. G3P2002 at [redacted]w[redacted]d being seen today for ongoing prenatal care.  She is currently monitored for the following issues for this high-risk pregnancy and has CIN III (cervical intraepithelial neoplasia grade III) with severe dysplasia; Dysmenorrhea; Menorrhagia; ASCUS with positive high risk HPV cervical; Supervision of high risk pregnancy, antepartum; History of conization of cervix; and Supervision of high-risk pregnancy of elderly multigravida on their problem list.  ----------------------------------------------------------------------------------- Patient reports no complaints.   Contractions: Not present. Vag. Bleeding: None.  Movement: Present. Denies leaking of fluid.  U/S shows growth at 67th %ile, AFI 7.7 cm.   ----------------------------------------------------------------------------------- The following portions of the patient's history were reviewed and updated as appropriate: allergies, current medications, past family history, past medical history, past social history, past surgical history and problem list. Problem list updated.   Objective  Blood pressure 112/64, weight 166 lb (75.3 kg), last menstrual period 10/22/2017. Pregravid weight 150 lb (68 kg) Total Weight Gain 16 lb (7.258 kg) Urinalysis: Urine Protein    Urine Glucose    Fetal Status: Fetal Heart Rate (bpm): Present   Movement: Present  Presentation: Vertex  General:  Alert, oriented and cooperative. Patient is in no acute distress.  Skin: Skin is warm and dry. No rash noted.   Cardiovascular: Normal heart rate noted  Respiratory: Normal respiratory effort, no problems with respiration noted  Abdomen: Soft, gravid, appropriate for gestational age. Pain/Pressure: Present     Pelvic:  Cervical exam deferred        Extremities: Normal range of motion.  Edema: None  Mental Status: Normal mood and affect. Normal behavior. Normal judgment and thought  content.   Imaging Results US Ob Follow Up  Result Date: 06/30/2018 Patient Name: Hayley Obrien DOB: 09/16/1975 MRN: [id number] ULTRASOUND REPORT Location: [facility] Date of Service: 06/30/2018 Indications:growth/afi Findings: Mason Jim intrauterine pregnancy is visualized with FHR at 146 BPM. Biometrics give an (U/S) Gestational age of [redacted]w[redacted]d and an (U/S) EDD of 08/04/18; this correlates with the clinically established Estimated Date of Delivery: 08/06/18. Fetal presentation is Cephalic. Placenta: posterior. Grade: 2 AFI: 7.7 cm Growth percentile is 67.4 (AC >97.7%tile, measuring 3 weeks ahead). EFW: 2,812g (6lbs 3oz) Impression: 1. [redacted]w[redacted]d Viable Singleton Intrauterine pregnancy previously established criteria. 2. Growth is 67.4 %ile (AC >97.7%tile, measuring 3 weeks ahead). AFI is 7.7 cm. Darlina Guys, RDMS RVT The ultrasound images and findings were reviewed by me and I agree with the above report. Thomasene Mohair, MD, [facility], [facility] 06/30/2018 2:07 PM       Assessment   42 y.o. [id number] at [redacted]w[redacted]d by  08/06/2018, by Ultrasound presenting for routine prenatal visit  Plan   Pregnancy #3 Problems (from 12/01/17 to present)    Problem Noted Resolved   Supervision of high risk pregnancy, antepartum 12/01/2017 by Conard Novak, MD No   Overview Addendum 05/15/2018 11:17 AM by Natale Milch, MD    Clinic [facility] Prenatal Labs  Dating  8wk [place] Blood type: O/Positive/-- (09/10 1501)   Genetic Screen NIPS: normal xx Antibody:Negative (09/10 1501)  Anatomic [place]  complete Rubella: 1.15 (09/10 1501) Varicella:   GTT Early:               Third trimester:  RPR: Non Reactive (09/10 1501)   Rhogam  not needed HBsAg: Negative (09/10 1501)   TDaP vaccine  Flu Shot: HIV: Non Reactive (09/10 1501)   Baby Food                                GBS:   Contraception  Pap: NIL 2019  CBB     CS/VBAC    Support Person            History of  conization of cervix 12/01/2017 by Conard Novak, MD No   Overview Addendum 12/27/2017  2:37 PM by Conard Novak, MD    LEEP and CKC in 2017 [ ]  u/s at 16 weeks for cervical length       Supervision of high-risk pregnancy of elderly multigravida 12/01/2017 by Conard Novak, MD No       Preterm labor symptoms and general obstetric precautions including but not limited to vaginal bleeding, contractions, leaking of fluid and fetal movement were reviewed in detail with the patient. Please refer to After Visit Summary for other counseling recommendations.   Return in about 2 weeks (around 07/14/2018) for U/S For afi and Routine Prenatal Appointment with NST.  Thomasene Mohair, MD, [facility], [facility] 06/30/2018 2:44 PM

## 2018-07-17 ENCOUNTER — Other Ambulatory Visit (HOSPITAL_COMMUNITY)
Admission: RE | Admit: 2018-07-17 | Discharge: 2018-07-17 | Disposition: A | Discharge status: Home / Self Care | Payer: Medicaid Other | Source: Ambulatory Visit | Attending: Obstetrics and Gynecology | Admitting: Obstetrics and Gynecology

## 2018-07-17 ENCOUNTER — Encounter: Payer: Self-pay | Admitting: Obstetrics and Gynecology

## 2018-07-17 ENCOUNTER — Ambulatory Visit (INDEPENDENT_AMBULATORY_CARE_PROVIDER_SITE_OTHER): Payer: Medicaid Other | Admitting: Obstetrics and Gynecology

## 2018-07-17 ENCOUNTER — Ambulatory Visit (INDEPENDENT_AMBULATORY_CARE_PROVIDER_SITE_OTHER): Payer: Medicaid Other

## 2018-07-17 VITALS — BP 116/70 | Wt 172.0 lb

## 2018-07-17 DIAGNOSIS — Z3A37 37 weeks gestation of pregnancy: Secondary | ICD-10-CM | POA: Diagnosis present

## 2018-07-17 DIAGNOSIS — O0990 Supervision of high risk pregnancy, unspecified, unspecified trimester: Secondary | ICD-10-CM | POA: Diagnosis present

## 2018-07-17 DIAGNOSIS — O09529 Supervision of elderly multigravida, unspecified trimester: Secondary | ICD-10-CM

## 2018-07-17 DIAGNOSIS — Z3689 Encounter for other specified antenatal screening: Secondary | ICD-10-CM

## 2018-07-17 DIAGNOSIS — O09523 Supervision of elderly multigravida, third trimester: Secondary | ICD-10-CM

## 2018-07-17 DIAGNOSIS — Z9889 Other specified postprocedural states: Secondary | ICD-10-CM

## 2018-07-17 NOTE — Progress Notes (Signed)
Routine Prenatal Care Visit  Subjective  Hayley Obrien is a 43 y.o. G3P2002 at [redacted]w[redacted]d being seen today for ongoing prenatal care.  She is currently monitored for the following issues for this high-risk pregnancy and has CIN III (cervical intraepithelial neoplasia grade III) with severe dysplasia; Dysmenorrhea; Menorrhagia; ASCUS with positive high risk HPV cervical; Supervision of high risk pregnancy, antepartum; History of conization of cervix; and Supervision of high-risk pregnancy of elderly multigravida on their problem list.  ----------------------------------------------------------------------------------- Patient reports no complaints.   Contractions: Not present. Vag. Bleeding: None.  Movement: Present. Denies leaking of fluid.  U/S: AFI 10.9 cm today.  ----------------------------------------------------------------------------------- The following portions of the patient's history were reviewed and updated as appropriate: allergies, current medications, past family history, past medical history, past social history, past surgical history and problem list. Problem list updated.   Objective  Blood pressure 116/70, weight 172 lb (78 kg), last menstrual period 10/22/2017. Pregravid weight 150 lb (68 kg) Total Weight Gain 22 lb (9.979 kg) Urinalysis: Urine Protein    Urine Glucose    Fetal Status: Fetal Heart Rate (bpm): present   Movement: Present  Presentation: Vertex  General:  Alert, oriented and cooperative. Patient is in no acute distress.  Skin: Skin is warm and dry. No rash noted.   Cardiovascular: Normal heart rate noted  Respiratory: Normal respiratory effort, no problems with respiration noted  Abdomen: Soft, gravid, appropriate for gestational age. Pain/Pressure: Absent     Pelvic:  Cervical exam performed Dilation: Closed Effacement (%): 50 Station: -3  Extremities: Normal range of motion.  Edema: None  Mental Status: Normal mood and affect. Normal behavior. Normal  judgment and thought content.   NST: Baseline FHR: 150 beats/min Variability: moderate Accelerations: present Decelerations: absent Tocometry: not done  Interpretation:  INDICATIONS: age > 40 years RESULTS:  A NST procedure was performed with FHR monitoring and a normal baseline established, appropriate time of 20-40 minutes of evaluation, and accels >2 seen w 15x15 characteristics.  Results show a REACTIVE NST.    Imaging Results US Ob Limited  Result Date: 07/17/2018 Patient Name: Hayley Obrien DOB: 09/20/1975 MRN: [id number] ULTRASOUND REPORT Location: [facility] Date of Service: 07/17/2018 Indications:AFI Findings: Mason Jim intrauterine pregnancy is visualized with FHR at 150 BPM. Fetal presentation is Cephalic. Placenta: posterior. Grade: 2 AFI: 10.9 cm Impression: 1. [redacted]w[redacted]d Viable Singleton Intrauterine pregnancy dated by previously established criteria. 2. AFI is 10.9 cm. Darlina Guys, RDMS RVT The ultrasound images and findings were reviewed by me and I agree with the above report. Thomasene Mohair, MD, [facility], [facility] 07/17/2018 2:53 PM       Assessment   42 y.o. [id number] at [redacted]w[redacted]d by  08/06/2018, by Ultrasound presenting for routine prenatal visit  Plan   Pregnancy #3 Problems (from 12/01/17 to present)    Problem Noted Resolved   Supervision of high risk pregnancy, antepartum 12/01/2017 by Conard Novak, MD No   Overview Addendum 05/15/2018 11:17 AM by Natale Milch, MD    Clinic [facility] Prenatal Labs  Dating  8wk [place] Blood type: O/Positive/-- (09/10 1501)   Genetic Screen NIPS: normal xx Antibody:Negative (09/10 1501)  Anatomic [place]  complete Rubella: 1.15 (09/10 1501) Varicella:   GTT Early:               Third trimester:  RPR: Non Reactive (09/10 1501)   Rhogam  not needed HBsAg: Negative (09/10 1501)   TDaP vaccine  Flu Shot: HIV: Non Reactive (09/10 1501)   Baby Food                                GBS:    Contraception  Pap: NIL 2019  CBB     CS/VBAC    Support Person            History of conization of cervix 12/01/2017 by Conard Novak, MD No   Overview Addendum 12/27/2017  2:37 PM by Conard Novak, MD    LEEP and CKC in 2017 [ ]  u/s at 16 weeks for cervical length       Supervision of high-risk pregnancy of elderly multigravida 12/01/2017 by Conard Novak, MD No       Term labor symptoms and general obstetric precautions including but not limited to vaginal bleeding, contractions, leaking of fluid and fetal movement were reviewed in detail with the patient. Please refer to After Visit Summary for other counseling recommendations.   Return in about 1 week (around 07/24/2018) for U/S for AFI, Routine Prenatal Appointment/NST.  Thomasene Mohair, MD, [facility], [facility] 07/17/2018 3:09 PM

## 2018-07-19 LAB — STREP GP B NAA: Strep Gp B NAA: NEGATIVE

## 2018-07-20 LAB — CERVICOVAGINAL ANCILLARY ONLY
Chlamydia: NEGATIVE
Neisseria Gonorrhea: NEGATIVE

## 2018-07-25 ENCOUNTER — Ambulatory Visit (INDEPENDENT_AMBULATORY_CARE_PROVIDER_SITE_OTHER): Payer: Medicaid Other | Admitting: Obstetrics and Gynecology

## 2018-07-25 ENCOUNTER — Encounter: Payer: Self-pay | Admitting: Obstetrics and Gynecology

## 2018-07-25 ENCOUNTER — Ambulatory Visit (INDEPENDENT_AMBULATORY_CARE_PROVIDER_SITE_OTHER): Payer: Medicaid Other

## 2018-07-25 VITALS — BP 118/74 | Wt 175.0 lb

## 2018-07-25 DIAGNOSIS — Z3A38 38 weeks gestation of pregnancy: Secondary | ICD-10-CM | POA: Diagnosis not present

## 2018-07-25 DIAGNOSIS — O09523 Supervision of elderly multigravida, third trimester: Secondary | ICD-10-CM

## 2018-07-25 DIAGNOSIS — O0990 Supervision of high risk pregnancy, unspecified, unspecified trimester: Secondary | ICD-10-CM

## 2018-07-25 DIAGNOSIS — Z9889 Other specified postprocedural states: Secondary | ICD-10-CM

## 2018-07-25 DIAGNOSIS — Z3A37 37 weeks gestation of pregnancy: Secondary | ICD-10-CM

## 2018-07-25 DIAGNOSIS — Z3689 Encounter for other specified antenatal screening: Secondary | ICD-10-CM

## 2018-07-25 DIAGNOSIS — O09529 Supervision of elderly multigravida, unspecified trimester: Secondary | ICD-10-CM

## 2018-07-25 NOTE — Progress Notes (Signed)
Routine Prenatal Care Visit  Subjective  Hayley Obrien is a 43 y.o. G3P2002 at [redacted]w[redacted]d being seen today for ongoing prenatal care.  She is currently monitored for the following issues for this high-risk pregnancy and has CIN III (cervical intraepithelial neoplasia grade III) with severe dysplasia; Dysmenorrhea; Menorrhagia; ASCUS with positive high risk HPV cervical; Supervision of high risk pregnancy, antepartum; History of conization of cervix; and Supervision of high-risk pregnancy of elderly multigravida on their problem list.  ----------------------------------------------------------------------------------- Patient reports occasional ctx with small amount of brown-tinged mucus discharge.   Contractions: Irregular. Vag. Bleeding: None, Scant.  Movement: Present. Denies leaking of fluid.  U/S with AFI 9 cm today.  ----------------------------------------------------------------------------------- The following portions of the patient's history were reviewed and updated as appropriate: allergies, current medications, past family history, past medical history, past social history, past surgical history and problem list. Problem list updated.   Objective  Blood pressure 118/74, weight 175 lb (79.4 kg), last menstrual period 10/22/2017. Pregravid weight 150 lb (68 kg) Total Weight Gain 25 lb (11.3 kg) Urinalysis: Urine Protein    Urine Glucose    Fetal Status: Fetal Heart Rate (bpm): 145   Movement: Present  Presentation: Vertex  General:  Alert, oriented and cooperative. Patient is in no acute distress.  Skin: Skin is warm and dry. No rash noted.   Cardiovascular: Normal heart rate noted  Respiratory: Normal respiratory effort, no problems with respiration noted  Abdomen: Soft, gravid, appropriate for gestational age. Pain/Pressure: Absent     Pelvic:  Cervical exam performed Dilation: 1 Effacement (%): 50 Station: -3  Extremities: Normal range of motion.  Edema: None  Mental Status:  Normal mood and affect. Normal behavior. Normal judgment and thought content.   NST: Baseline FHR: 145 beats/min Variability: moderate Accelerations: present Decelerations: absent Tocometry: not done  Interpretation:  INDICATIONS: maternal age 81+ yo RESULTS:  A NST procedure was performed with FHR monitoring and a normal baseline established, appropriate time of 20-40 minutes of evaluation, and accels >2 seen w 15x15 characteristics.  Results show a REACTIVE NST.    Imaging Results US Ob Limited  Result Date: 07/25/2018 Patient Name: Flannery Smithart DOB: 03/22/1976 MRN: [id number] ULTRASOUND REPORT Location: [facility] Date of Service: 07/25/2018 Indications:AFI Findings: Mason Jim intrauterine pregnancy is visualized with FHR at 149 BPM. Fetal presentation is Cephalic. Placenta: posterior. Grade: 2 AFI: 9.7 cm Impression: 1. [redacted]w[redacted]d Viable Singleton Intrauterine pregnancy dated by previously established criteria. 2. AFI is 9.7 cm. Darlina Guys, RDMS RVT The ultrasound images and findings were reviewed by me and I agree with the above report. Thomasene Mohair, MD, [facility], [facility] 07/25/2018 2:49 PM       Assessment   42 y.o. [id number] at [redacted]w[redacted]d by  08/06/2018, by Ultrasound presenting for routine prenatal visit  Plan   Pregnancy #3 Problems (from 12/01/17 to present)    Problem Noted Resolved   Supervision of high risk pregnancy, antepartum 12/01/2017 by Conard Novak, MD No   Overview Addendum 05/15/2018 11:17 AM by Natale Milch, MD    Clinic [facility] Prenatal Labs  Dating  8wk [place] Blood type: O/Positive/-- (09/10 1501)   Genetic Screen NIPS: normal xx Antibody:Negative (09/10 1501)  Anatomic [place]  complete Rubella: 1.15 (09/10 1501) Varicella:   GTT Early:               Third trimester:  RPR: Non Reactive (09/10 1501)   Rhogam  not needed HBsAg: Negative (09/10 1501)  TDaP vaccine                       Flu Shot: HIV: Non Reactive (09/10 1501)    Baby Food                                GBS:   Contraception  Pap: NIL 2019  CBB     CS/VBAC    Support Person            History of conization of cervix 12/01/2017 by Conard Novak, MD No   Overview Addendum 12/27/2017  2:37 PM by Conard Novak, MD    LEEP and CKC in 2017 [ ]  u/s at 16 weeks for cervical length       Supervision of high-risk pregnancy of elderly multigravida 12/01/2017 by Conard Novak, MD No       Term labor symptoms and general obstetric precautions including but not limited to vaginal bleeding, contractions, leaking of fluid and fetal movement were reviewed in detail with the patient. Please refer to After Visit Summary for other counseling recommendations.   Return in about 1 week (around 08/01/2018) for U/S for afi with routine prenatal/NST (w/SDJ, if possible).  Thomasene Mohair, MD, [facility], [facility] 07/25/2018 3:15 PM

## 2018-07-26 ENCOUNTER — Other Ambulatory Visit: Payer: Self-pay

## 2018-07-26 ENCOUNTER — Inpatient Hospital Stay
Admission: EM | Admit: 2018-07-26 | Discharge: 2018-07-27 | DRG: 806 | Disposition: A | Discharge status: Home / Self Care | Payer: Medicaid Other | Attending: Obstetrics & Gynecology | Admitting: Obstetrics & Gynecology

## 2018-07-26 ENCOUNTER — Encounter: Payer: Self-pay | Admitting: Obstetrics & Gynecology

## 2018-07-26 DIAGNOSIS — O26893 Other specified pregnancy related conditions, third trimester: Secondary | ICD-10-CM | POA: Diagnosis present

## 2018-07-26 DIAGNOSIS — Z9889 Other specified postprocedural states: Secondary | ICD-10-CM

## 2018-07-26 DIAGNOSIS — Z3A38 38 weeks gestation of pregnancy: Secondary | ICD-10-CM

## 2018-07-26 DIAGNOSIS — O9081 Anemia of the puerperium: Secondary | ICD-10-CM | POA: Diagnosis not present

## 2018-07-26 DIAGNOSIS — O4202 Full-term premature rupture of membranes, onset of labor within 24 hours of rupture: Secondary | ICD-10-CM

## 2018-07-26 DIAGNOSIS — D62 Acute posthemorrhagic anemia: Secondary | ICD-10-CM | POA: Diagnosis not present

## 2018-07-26 LAB — CBC
HCT: 35.3 % — ABNORMAL LOW (ref 36.0–46.0)
HEMOGLOBIN: 11.8 g/dL — AB (ref 12.0–15.0)
MCH: 26.2 pg (ref 26.0–34.0)
MCHC: 33.4 g/dL (ref 30.0–36.0)
MCV: 78.4 fL — ABNORMAL LOW (ref 80.0–100.0)
Platelets: 275 10*3/uL (ref 150–400)
RBC: 4.5 MIL/uL (ref 3.87–5.11)
RDW: 15 % (ref 11.5–15.5)
WBC: 14.4 10*3/uL — ABNORMAL HIGH (ref 4.0–10.5)
nRBC: 0 % (ref 0.0–0.2)

## 2018-07-26 LAB — TYPE AND SCREEN
ABO/RH(D): O POS
Antibody Screen: NEGATIVE

## 2018-07-26 MED ORDER — COCONUT OIL OIL: 1.0000 "application " | TOPICAL_OIL | Status: DC | PRN

## 2018-07-26 MED ORDER — SODIUM CHLORIDE 0.9% FLUSH: 3.0000 mL | INTRAVENOUS | Status: DC | PRN

## 2018-07-26 MED ORDER — WITCH HAZEL-GLYCERIN EX PADS: 1.0000 "application " | MEDICATED_PAD | CUTANEOUS | Status: DC | PRN

## 2018-07-26 MED ORDER — IBUPROFEN 600 MG PO TABS
ORAL_TABLET | ORAL | Status: AC
  Filled 2018-07-26: qty 1

## 2018-07-26 MED ORDER — BENZOCAINE-MENTHOL 20-0.5 % EX AERO: 1.0000 "application " | INHALATION_SPRAY | CUTANEOUS | Status: DC | PRN

## 2018-07-26 MED ORDER — OXYTOCIN 40 UNITS IN NORMAL SALINE INFUSION - SIMPLE MED: 2.5000 [IU]/h | INTRAVENOUS | Status: DC

## 2018-07-26 MED ORDER — ONDANSETRON HCL 4 MG/2ML IJ SOLN: 4.0000 mg | Freq: Four times a day (QID) | INTRAMUSCULAR | Status: DC | PRN

## 2018-07-26 MED ORDER — OXYCODONE-ACETAMINOPHEN 5-325 MG PO TABS: 1.0000 | ORAL_TABLET | ORAL | Status: DC | PRN

## 2018-07-26 MED ORDER — ACETAMINOPHEN 325 MG PO TABS: 650.0000 mg | ORAL_TABLET | ORAL | Status: DC | PRN

## 2018-07-26 MED ORDER — IBUPROFEN 600 MG PO TABS
600.0000 mg | ORAL_TABLET | Freq: Four times a day (QID) | ORAL | Status: DC
  Administered 2018-07-26: 600 mg via ORAL

## 2018-07-26 MED ORDER — DIPHENHYDRAMINE HCL 25 MG PO CAPS: 25.0000 mg | ORAL_CAPSULE | Freq: Four times a day (QID) | ORAL | Status: DC | PRN

## 2018-07-26 MED ORDER — ZOLPIDEM TARTRATE 5 MG PO TABS: 5.0000 mg | ORAL_TABLET | Freq: Every evening | ORAL | Status: DC | PRN

## 2018-07-26 MED ORDER — SODIUM CHLORIDE 0.9 % IV SOLN: 250.0000 mL | INTRAVENOUS | Status: DC | PRN

## 2018-07-26 MED ORDER — OXYCODONE-ACETAMINOPHEN 5-325 MG PO TABS: 2.0000 | ORAL_TABLET | ORAL | Status: DC | PRN

## 2018-07-26 MED ORDER — OXYTOCIN BOLUS FROM INFUSION
500.0000 mL | Freq: Once | INTRAVENOUS | Status: AC
  Administered 2018-07-26: 500 mL via INTRAVENOUS

## 2018-07-26 MED ORDER — BUTORPHANOL TARTRATE 2 MG/ML IJ SOLN: 1.0000 mg | INTRAMUSCULAR | Status: DC | PRN

## 2018-07-26 MED ORDER — OXYTOCIN 10 UNIT/ML IJ SOLN
INTRAMUSCULAR | Status: AC
  Filled 2018-07-26: qty 2

## 2018-07-26 MED ORDER — LIDOCAINE HCL (PF) 1 % IJ SOLN
INTRAMUSCULAR | Status: AC
  Administered 2018-07-26: 30 mL via SUBCUTANEOUS
  Filled 2018-07-26: qty 30

## 2018-07-26 MED ORDER — ONDANSETRON HCL 4 MG PO TABS: 4.0000 mg | ORAL_TABLET | ORAL | Status: DC | PRN

## 2018-07-26 MED ORDER — DIBUCAINE 1 % RE OINT: 1.0000 "application " | TOPICAL_OINTMENT | RECTAL | Status: DC | PRN

## 2018-07-26 MED ORDER — SODIUM CHLORIDE 0.9% FLUSH: 3.0000 mL | Freq: Two times a day (BID) | INTRAVENOUS | Status: DC

## 2018-07-26 MED ORDER — ONDANSETRON HCL 4 MG/2ML IJ SOLN: 4.0000 mg | INTRAMUSCULAR | Status: DC | PRN

## 2018-07-26 MED ORDER — IBUPROFEN 600 MG PO TABS
600.0000 mg | ORAL_TABLET | Freq: Four times a day (QID) | ORAL | Status: DC
  Administered 2018-07-26 – 2018-07-27 (×5): 600 mg via ORAL
  Filled 2018-07-26 (×5): qty 1

## 2018-07-26 MED ORDER — SENNOSIDES-DOCUSATE SODIUM 8.6-50 MG PO TABS
2.0000 | ORAL_TABLET | ORAL | Status: DC
  Administered 2018-07-27: 2 via ORAL
  Filled 2018-07-26 (×2): qty 2

## 2018-07-26 MED ORDER — SIMETHICONE 80 MG PO CHEW: 80.0000 mg | CHEWABLE_TABLET | ORAL | Status: DC | PRN

## 2018-07-26 MED ORDER — LIDOCAINE HCL (PF) 1 % IJ SOLN
30.0000 mL | INTRAMUSCULAR | Status: AC | PRN
  Administered 2018-07-26: 30 mL via SUBCUTANEOUS

## 2018-07-26 MED ORDER — OXYTOCIN 40 UNITS IN NORMAL SALINE INFUSION - SIMPLE MED
INTRAVENOUS | Status: AC
  Administered 2018-07-26: 500 mL via INTRAVENOUS
  Filled 2018-07-26: qty 1000

## 2018-07-26 MED ORDER — MISOPROSTOL 200 MCG PO TABS
ORAL_TABLET | ORAL | Status: AC
  Filled 2018-07-26: qty 4

## 2018-07-26 MED ORDER — AMMONIA AROMATIC IN INHA
RESPIRATORY_TRACT | Status: AC
  Filled 2018-07-26: qty 10

## 2018-07-26 MED ORDER — LACTATED RINGERS IV SOLN: 500.0000 mL | INTRAVENOUS | Status: DC | PRN

## 2018-07-26 MED ORDER — LACTATED RINGERS IV SOLN
INTRAVENOUS | Status: DC
  Administered 2018-07-26: 06:00:00 via INTRAVENOUS

## 2018-07-26 NOTE — OB Triage Note (Signed)
Pt G3P2 [redacted]w[redacted]d presents to L&D c/o contractions that started yesterday and picked up at 2am. Pt states she had some brown discharge at 2am as well. Denies leaking of fluid, or vaginal bleeding. External monitors applied, VS WNL. Dr Tiburcio Pea notified of pt's arrival

## 2018-07-26 NOTE — Lactation Note (Signed)
This note was copied from a baby's chart. Lactation Consultation Note  Patient Name: Hayley Obrien Date: 07/26/2018 Reason for consult: Follow-up assessment   Maternal Data Has patient been taught Hand Expression?: Yes  Feeding Feeding Type: Breast Fed  LATCH Score Latch: Grasps breast easily, tongue down, lips flanged, rhythmical sucking.  Audible Swallowing: A few with stimulation  Type of Nipple: Everted at rest and after stimulation  Comfort (Breast/Nipple): Soft / non-tender  Hold (Positioning): Assistance needed to correctly position infant at breast and maintain latch.  LATCH Score: 8  Interventions Interventions: Assisted with latch;Skin to skin;Hand express;Adjust position;Support pillows  Lactation Tools Discussed/Used     Consult Status Consult Status: Follow-up Date: 07/26/18 Follow-up type: In-patient  Mother states that she has breast implants and is curious to know if infant can breastfeed. LC reassured mother that she could breastfeed infant. Mother states that no breast tissue was removed and the 4th intercostal nerve was not cut. Infant was able to successfully latch on to the breast. Parents were taught how to flange infant's lips out so that she can achieve a deep latch. Mother denies pain or discomfort during the breastfeeding session. She was educated on how to hand express, benefits of skin to skin, frequency of wet and dirty diapers to expect and stages of milk transition.   Arlyss Gandy 07/26/2018, 3:21 PM

## 2018-07-26 NOTE — H&P (Signed)
Obstetrics Admission History & Physical   Contractions   HPI:  43 y.o. [id number] @ [redacted]w[redacted]d (08/06/2018, by Ultrasound). Admitted on 07/26/2018:   Patient Active Problem List   Diagnosis Date Noted  . Labor, precipitous 07/26/2018  . Supervision of high risk pregnancy, antepartum 12/01/2017  . History of conization of cervix 12/01/2017  . Supervision of high-risk pregnancy of elderly multigravida 12/01/2017  . ASCUS with positive high risk HPV cervical 02/22/2017  . Dysmenorrhea 01/10/2017  . Menorrhagia 01/10/2017  . CIN III (cervical intraepithelial neoplasia grade III) with severe dysplasia 11/25/2015     Presents for ctxs since 0200 at term, no ROM until here just now (w meconium).  No VB, good FM.  No recent problems.  AMA..   Prenatal care at: at [facility]. Pregnancy complicated by AMA.  ROS: A review of systems was performed and negative, except as stated in the above HPI. PMHx:  Past Medical History:  Diagnosis Date  . Abnormal Pap smear of cervix   . UTI (lower urinary tract infection) 11-2015   resolved per pt and is to f/u with dr Jean Rosenthal prior to upcoming surgery   PSHx:  Past Surgical History:  Procedure Laterality Date  . BREAST ENHANCEMENT SURGERY  2008  . CERVICAL BIOPSY  W/ LOOP ELECTRODE EXCISION    . CERVICAL CONIZATION W/BX N/A 11/25/2015   Procedure: CONIZATION CERVIX WITH BIOPSY;  Surgeon: Conard Novak, MD;  Location: [facility] ORS;  Service: Gynecology;  Laterality: N/A;  . NO PAST SURGERIES     Medications:  Medications Prior to Admission  Medication Sig Dispense Refill Last Dose  . albuterol (PROVENTIL HFA;VENTOLIN HFA) 108 (90 Base) MCG/ACT inhaler Inhale 2 puffs into the lungs every 6 (six) hours as needed for wheezing or shortness of breath (cough). 1 Inhaler 0 07/25/2018 at Unknown time  . Prenat-FeFum-FePo-FA-DHA w/o A (PROVIDA DHA) 16-16-1.25-110 MG CAPS Take 1 tablet by mouth daily. 30 capsule 8 07/25/2018 at Unknown time   Allergies: has No Known  Allergies. OBHx:  OB History  Gravida Para Term Preterm AB Living  3 2 2     2   SAB TAB Ectopic Multiple Live Births          2    # Outcome Date GA Lbr Len/2nd Weight Sex Delivery Anes PTL Lv  3 Current           2 Term           1 Term            JYN:WGNFAOZH/YQMVHQIONGEX except as detailed in HPI.Marland Kitchen  No family history of birth defects. Soc Hx: Alcohol: none and Recreational drug use: none  Objective:  There were no vitals filed for this visit. Constitutional: Well nourished, well developed female in no acute distress.  HEENT: normal Skin: Warm and dry.  Cardiovascular:Regular rate and rhythm.   Extremity: trace to 1+ bilateral pedal edema Respiratory: Clear to auscultation bilateral. Normal respiratory effort Abdomen: gravid, ND, FHT present, moderate tenderness on exam Back: no CVAT Neuro: DTRs 2+, Cranial nerves grossly intact Psych: Alert and Oriented x3. No memory deficits. Normal mood and affect.  MS: normal gait, normal bilateral lower extremity ROM/strength/stability.  Pelvic exam: is not limited by body habitus EGBUS: within normal limits Vagina: within normal limits and with normal mucosa Cervix: C/C/0 Uterus: Spontaneous uterine activity  Adnexa: not evaluated  EFM:FHR: 140 bpm, variability: moderate,  accelerations:  Present,  decelerations:  Absent Toco: Frequency: Every 2-4 minutes   Perinatal info:  Blood type: O positive Rubella- Immune Varicella -Unknown TDaP Given during third trimester of this pregnancy RPR NR / HIV Neg/ HBsAg Neg   Assessment & Plan:   43 y.o. [id number] @ [redacted]w[redacted]d, Admitted on 07/26/2018:Active precipitous labor as she is complete at admission and has SROM w meconium here in room    Admit for labor and Fetal Wellbeing Reassuring  Neonatal aware of meconium  Annamarie Major, MD, [facility], [facility] 07/26/2018  5:46 AM

## 2018-07-26 NOTE — Discharge Instructions (Signed)
Vaginal Delivery, Care After °Refer to this sheet in the next few weeks. These instructions provide you with information about caring for yourself after vaginal delivery. Your health care provider may also give you more specific instructions. Your treatment has been planned according to current medical practices, but problems sometimes occur. Call your health care provider if you have any problems or questions. °What can I expect after the procedure? °After vaginal delivery, it is common to have: °· Some bleeding from your vagina. °· Soreness in your abdomen, your vagina, and the area of skin between your vaginal opening and your anus (perineum). °· Pelvic cramps. °· Fatigue. °Follow these instructions at home: °Medicines °· Take over-the-counter and prescription medicines only as told by your health care provider. °· If you were prescribed an antibiotic medicine, take it as told by your health care provider. Do not stop taking the antibiotic until it is finished. °Driving ° °· Do not drive or operate heavy machinery while taking prescription pain medicine. °· Do not drive for 24 hours if you received a sedative. °Lifestyle °· Do not drink alcohol. This is especially important if you are breastfeeding or taking medicine to relieve pain. °· Do not use tobacco products, including cigarettes, chewing tobacco, or e-cigarettes. If you need help quitting, ask your health care provider. °Eating and drinking °· Drink at least 8 eight-ounce glasses of water every day unless you are told not to by your health care provider. If you choose to breastfeed your baby, you may need to drink more water than this. °· Eat high-fiber foods every day. These foods may help prevent or relieve constipation. High-fiber foods include: °? Whole grain cereals and breads. °? Brown rice. °? Beans. °? Fresh fruits and vegetables. °Activity °· Return to your normal activities as told by your health care provider. Ask your health care provider what  activities are safe for you. °· Rest as much as possible. Try to rest or take a nap when your baby is sleeping. °· Do not lift anything that is heavier than your baby or 10 lb (4.5 kg) until your health care provider says that it is safe. °· Talk with your health care provider about when you can engage in sexual activity. This may depend on your: °? Risk of infection. °? Rate of healing. °? Comfort and desire to engage in sexual activity. °Vaginal Care °· If you have an episiotomy or a vaginal tear, check the area every day for signs of infection. Check for: °? More redness, swelling, or pain. °? More fluid or blood. °? Warmth. °? Pus or a bad smell. °· Do not use tampons or douches until your health care provider says this is safe. °· Watch for any blood clots that may pass from your vagina. These may look like clumps of dark red, brown, or black discharge. °General instructions °· Keep your perineum clean and dry as told by your health care provider. °· Wear loose, comfortable clothing. °· Wipe from front to back when you use the toilet. °· Ask your health care provider if you can shower or take a bath. If you had an episiotomy or a perineal tear during labor and delivery, your health care provider may tell you not to take baths for a certain length of time. °· Wear a bra that supports your breasts and fits you well. °· If possible, have someone help you with household activities and help care for your baby for at least a few days after you   leave the hospital. °· Keep all follow-up visits for you and your baby as told by your health care provider. This is important. °Contact a health care provider if: °· You have: °? Vaginal discharge that has a bad smell. °? Difficulty urinating. °? Pain when urinating. °? A sudden increase or decrease in the frequency of your bowel movements. °? More redness, swelling, or pain around your episiotomy or vaginal tear. °? More fluid or blood coming from your episiotomy or vaginal  tear. °? Pus or a bad smell coming from your episiotomy or vaginal tear. °? A fever. °? A rash. °? Little or no interest in activities you used to enjoy. °? Questions about caring for yourself or your baby. °· Your episiotomy or vaginal tear feels warm to the touch. °· Your episiotomy or vaginal tear is separating or does not appear to be healing. °· Your breasts are painful, hard, or turn red. °· You feel unusually sad or worried. °· You feel nauseous or you vomit. °· You pass large blood clots from your vagina. If you pass a blood clot from your vagina, save it to show to your health care provider. Do not flush blood clots down the toilet without having your health care provider look at them. °· You urinate more than usual. °· You are dizzy or light-headed. °· You have not breastfed at all and you have not had a menstrual period for 12 weeks after delivery. °· You have stopped breastfeeding and you have not had a menstrual period for 12 weeks after you stopped breastfeeding. °Get help right away if: °· You have: °? Pain that does not go away or does not get better with medicine. °? Chest pain. °? Difficulty breathing. °? Blurred vision or spots in your vision. °? Thoughts about hurting yourself or your baby. °· You develop pain in your abdomen or in one of your legs. °· You develop a severe headache. °· You faint. °· You bleed from your vagina so much that you fill two sanitary pads in one hour. °This information is not intended to replace advice given to you by your health care provider. Make sure you discuss any questions you have with your health care provider. °Document Released: 04/30/2000 Document Revised: 10/15/2015 Document Reviewed: 05/18/2015 °Elsevier Interactive Patient Education © 2019 [employer] Inc. ° °

## 2018-07-26 NOTE — Discharge Summary (Signed)
OB Discharge Summary     Patient Name: Hayley Obrien DOB: May 08, 1976 MRN: [id number]  Date of admission: 07/26/2018 Delivering MD: Letitia Libra, MD  Date of Delivery: 07/26/2018  Date of discharge: 07/27/2018  Admitting diagnosis: contractions Intrauterine pregnancy: [redacted]w[redacted]d     Secondary diagnosis: None     Discharge diagnosis: Term Pregnancy Delivered, No other diagnosis                         Hospital course:  Onset of Labor With Vaginal Delivery     43 y.o. yo G3P2002 at [redacted]w[redacted]d was admitted in Active Labor on 07/26/2018. Patient had an uncomplicated labor course as follows:  Membrane Rupture Time/Date: 5:25 AM ,07/26/2018  Meconium noted throughout second stage and at delivery, neonatate vigorous and clear Intrapartum Procedures: Episiotomy: None [1]                                         Lacerations:  2nd degree [3];Perineal [11]  Patient had a delivery of a Viable infant. 07/26/2018  Information for the patient's newborn:  Jherica, Krichbaum [[id number]]  Delivery Method: Vag-Spont  Pateint had an uncomplicated postpartum course.  She is ambulating, tolerating a regular diet, passing flatus, and urinating well. Patient is discharged home in stable condition on 07/27/18.                                                                  Post partum procedures:none  Complications: None  Physical exam on 07/27/2018: Vitals:   07/26/18 1614 07/26/18 1947 07/26/18 2332 07/27/18 0730  BP: 106/65 110/63 (!) 106/59 103/68  Pulse: 67 87 64 (!) 57  Resp: 18 18 18 18   Temp: 98.4 F (36.9 C) 97.8 F (36.6 C) 98.3 F (36.8 C) 97.9 F (36.6 C)  TempSrc: Oral Oral Oral Oral  SpO2: 99% 100% 100% 100%  Weight:      Height:       General: alert Lochia: appropriate Uterine Fundus: firm DVT Evaluation: No evidence of DVT seen on physical exam.  Labs: Lab Results  Component Value Date   WBC 15.5 (H) 07/27/2018   HGB 9.4 (L) 07/27/2018   HCT 29.5 (L) 07/27/2018   MCV 80.8  07/27/2018   PLT 238 07/27/2018   CMP Latest Ref Rng & Units 11/25/2015  Glucose 65 - 99 mg/dL 92  BUN 6 - 20 mg/dL 9  Creatinine 2.24 - 8.25 mg/dL 0.03  Sodium 704 - 888 mmol/L 139  Potassium 3.5 - 5.1 mmol/L 3.6  Chloride 101 - 111 mmol/L 107  CO2 22 - 32 mmol/L 25  Calcium 8.9 - 10.3 mg/dL 9.2  Total Protein 6.5 - 8.1 g/dL 7.8  Total Bilirubin 0.3 - 1.2 mg/dL 0.4  Alkaline Phos 38 - 126 U/L 90  AST 15 - 41 U/L 23  ALT 14 - 54 U/L 29    Discharge instruction: per After Visit Summary.  Medications:  Allergies as of 07/27/2018   No Known Allergies     Medication List    TAKE these medications   acetaminophen 325 MG tablet Commonly known as:  Tylenol Take 2 tablets (  650 mg total) by mouth every 4 (four) hours as needed (for pain scale < 4).   albuterol 108 (90 Base) MCG/ACT inhaler Commonly known as:  PROVENTIL HFA;VENTOLIN HFA Inhale 2 puffs into the lungs every 6 (six) hours as needed for wheezing or shortness of breath (cough).   ibuprofen 600 MG tablet Commonly known as:  ADVIL,MOTRIN Take 1 tablet (600 mg total) by mouth every 6 (six) hours.   Provida DHA 16-16-1.25-110 MG Caps Take 1 tablet by mouth daily.            Discharge Care Instructions  (From admission, onward)         Start     Ordered   07/27/18 0000  Discharge wound care:    Comments:  SHOWER DAILY Wash incision gently with soap and water.  Call office with any drainage, redness, or firmness of the incision.   07/27/18 1446          Diet: routine diet  Activity: Advance as tolerated. Pelvic rest for 6 weeks.   Outpatient follow up: Follow-up Information    Nadara Mustard, MD. Schedule an appointment as soon as possible for a visit in 6 week(s).   Specialty:  Obstetrics and Gynecology Why:  Post Partum Contact information: [address] [place] [address] [phone]             Postpartum contraception: Tubal Ligation  Interval Rhogam Given postpartum:  no Rubella vaccine given postpartum: no Varicella vaccine given postpartum: no TDaP given antepartum or postpartum: Yes  Newborn Data: Live born female  Birth Weight:   APGAR: 8, 9  Newborn Delivery   Birth date/time:  07/26/2018 08:10:00 Delivery type:  Vaginal, Spontaneous      Baby Feeding: Breast  Disposition:home with mother  SIGNED:   Natale Milch, MD 07/27/2018 2:46 PM

## 2018-07-27 LAB — CBC
HCT: 29.5 % — ABNORMAL LOW (ref 36.0–46.0)
Hemoglobin: 9.4 g/dL — ABNORMAL LOW (ref 12.0–15.0)
MCH: 25.8 pg — ABNORMAL LOW (ref 26.0–34.0)
MCHC: 31.9 g/dL (ref 30.0–36.0)
MCV: 80.8 fL (ref 80.0–100.0)
Platelets: 238 10*3/uL (ref 150–400)
RBC: 3.65 MIL/uL — ABNORMAL LOW (ref 3.87–5.11)
RDW: 15.2 % (ref 11.5–15.5)
WBC: 15.5 10*3/uL — ABNORMAL HIGH (ref 4.0–10.5)
nRBC: 0 % (ref 0.0–0.2)

## 2018-07-27 LAB — RPR: RPR Ser Ql: NONREACTIVE

## 2018-07-27 MED ORDER — IBUPROFEN 600 MG PO TABS: 600.0000 mg | ORAL_TABLET | Freq: Four times a day (QID) | ORAL | 1 refills | Status: DC

## 2018-07-27 MED ORDER — FERROUS SULFATE 325 (65 FE) MG PO TABS: 325.0000 mg | ORAL_TABLET | Freq: Two times a day (BID) | ORAL | Status: DC

## 2018-07-27 MED ORDER — ACETAMINOPHEN 325 MG PO TABS: 650.0000 mg | ORAL_TABLET | ORAL | 1 refills | Status: DC | PRN

## 2018-07-27 NOTE — Progress Notes (Signed)
Patient discharged home with infant. Discharge instructions and prescriptions given and reviewed with patient. Patient verbalized understanding. Escorted out by auxillary.  

## 2018-07-27 NOTE — Lactation Note (Signed)
This note was copied from a baby's chart. Lactation Consultation Note  Patient Name: Hayley Obrien Today's Date: 07/27/2018 Reason for consult: Initial assessment    LATCH Score Latch: Grasps breast easily, tongue down, lips flanged, rhythmical sucking.  Audible Swallowing: A few with stimulation  Type of Nipple: Everted at rest and after stimulation  Comfort (Breast/Nipple): Soft / non-tender  Hold (Positioning): Assistance needed to correctly position infant at breast and maintain latch.  LATCH Score: 8  Interventions Interventions: Assisted with latch;Skin to skin;Hand express;Support pillows  Lactation Tools Discussed/Used     Consult Status Consult Status: Follow-up Follow-up type: In-patient    Trudee Grip 07/27/2018, 1:54 PM

## 2018-07-27 NOTE — Progress Notes (Signed)
PPD#1 SVD Subjective:  Ambulating in room. Denies pain. Voiding without difficulty. Tolerating a regular diet. Her baby has elevated bilirubin this morning, does not desire discharge at this time.  Objective:   Blood pressure 103/68, pulse (!) 57, temperature 97.9 F (36.6 C), temperature source Oral, resp. rate 18, height 5\' 3"  (1.6 m), weight 72.6 kg, last menstrual period 10/22/2017, SpO2 100 %, unknown if currently breastfeeding.  General: NAD Pulmonary: no increased work of breathing Abdomen: non-distended, non-tender Uterus:  fundus firm; lochia appropriate Extremities: no edema, no erythema, no tenderness, no signs of DVT  Results for orders placed or performed during the hospital encounter of 07/26/18 (from the past 72 hour(s))  CBC     Status: Abnormal   Collection Time: 07/26/18  6:09 AM  Result Value Ref Range   WBC 14.4 (H) 4.0 - 10.5 K/uL   RBC 4.50 3.87 - 5.11 MIL/uL   Hemoglobin 11.8 (L) 12.0 - 15.0 g/dL   HCT 11.5 (L) 72.6 - 20.3 %   MCV 78.4 (L) 80.0 - 100.0 fL   MCH 26.2 26.0 - 34.0 pg   MCHC 33.4 30.0 - 36.0 g/dL   RDW 55.9 74.1 - 63.8 %   Platelets 275 150 - 400 K/uL   nRBC 0.0 0.0 - 0.2 %    Comment: Performed at [facility], [address] [place], [place] [address]  RPR     Status: None   Collection Time: 07/26/18  6:09 AM  Result Value Ref Range   RPR Ser Ql Non Reactive Non Reactive    Comment: (NOTE) Performed At: [facility] [address] [place], [place] [id number] Jolene Schimke MD MG:[id number]   Type and screen     Status: None   Collection Time: 07/26/18  6:53 AM  Result Value Ref Range   ABO/RH(D) O POS    Antibody Screen NEG    Sample Expiration      07/29/2018 Performed at [facility], [address] [place], [place] [address]   CBC     Status: Abnormal   Collection Time: 07/27/18  6:06 AM  Result Value Ref Range   WBC 15.5 (H) 4.0 - 10.5 K/uL   RBC 3.65 (L) 3.87 - 5.11 MIL/uL   Hemoglobin 9.4  (L) 12.0 - 15.0 g/dL   HCT 50.3 (L) 88.8 - 28.0 %   MCV 80.8 80.0 - 100.0 fL   MCH 25.8 (L) 26.0 - 34.0 pg   MCHC 31.9 30.0 - 36.0 g/dL   RDW 03.4 91.7 - 91.5 %   Platelets 238 150 - 400 K/uL   nRBC 0.0 0.0 - 0.2 %    Comment: Performed at [facility], [address], [place], [place] [address]    Assessment:   43 y.o. [phone] postpartum day # 1 in good condition.  Plan:   1) Acute blood loss anemia - hemodynamically stable and asymptomatic - PO ferrous sulfate  2) Blood Type --/--/O POS (03/11 5374)   3) Rubella 1.15 (09/10 1501) / Varicella immune /TDAP status: received antepartum on 06/02/2018  4) Breastfeeding  5) Contraception: She discussed interval BTL with the CNM with a Spanish interpreter yesterday, and she has decided against this option. Undecided at this time about contraception.  6) Disposition: continue postpartum care.  Marcelyn Bruins, CNM 07/27/2018

## 2018-09-07 ENCOUNTER — Encounter: Payer: Self-pay | Admitting: Obstetrics & Gynecology

## 2018-09-07 ENCOUNTER — Ambulatory Visit (INDEPENDENT_AMBULATORY_CARE_PROVIDER_SITE_OTHER): Payer: Medicaid Other | Admitting: Obstetrics & Gynecology

## 2018-09-07 ENCOUNTER — Other Ambulatory Visit: Payer: Self-pay

## 2018-09-07 DIAGNOSIS — Z1389 Encounter for screening for other disorder: Secondary | ICD-10-CM

## 2018-09-07 NOTE — Progress Notes (Signed)
  OBSTETRICS POSTPARTUM CLINIC PROGRESS NOTE  Subjective:     Hayley Obrien is a 43 y.o. G1P3003 female who presents for a postpartum visit. She is 6 weeks postpartum following a Term pregnancy and delivery by Vaginal, no problems at delivery.  I have fully reviewed the prenatal and intrapartum course. Anesthesia: none.  Postpartum course has been complicated by uncomplicated.  Baby is feeding by Breast.  Bleeding: patient has not  resumed menses.  Bowel function is normal. Bladder function is normal.  Patient is not sexually active. Contraception method desired is BTL, but with Corona Virus restrictions is considering Paraguard.  Postpartum depression screening: negative. Edinburgh 2.  The following portions of the patient's history were reviewed and updated as appropriate: allergies, current medications, past family history, past medical history, past social history, past surgical history and problem list.  Review of Systems Pertinent items are noted in HPI.  Objective:    BP 120/80   Ht 5\' 2"  (1.575 m)   Wt 147 lb (66.7 kg)   BMI 26.89 kg/m   General:  alert and no distress   Breasts:  inspection negative, no nipple discharge or bleeding, no masses or nodularity palpable  Lungs: clear to auscultation bilaterally  Heart:  regular rate and rhythm, S1, S2 normal, no murmur, click, rub or gallop  Abdomen: soft, non-tender; bowel sounds normal; no masses,  no organomegaly.     Vulva:  normal  Vagina: normal vagina, no discharge, exudate, lesion, or erythema Gr 2 cystocele  Cervix:  no cervical motion tenderness and no lesions  Corpus: normal size, contour, position, consistency, mobility, non-tender  Adnexa:  normal adnexa and no mass, fullness, tenderness  Rectal Exam: Not performed.          Assessment:  Post Partum Care visit 1. Postpartum care following vaginal delivery Monitor cystocele for sx's Consider Paraguard IUD for birth contol as she does not want any hormonal  form of BC.  BTL on hold as she does not want surgery nor can we during the Corona virus restrictions  Plan:  See orders and Patient Instructions Contraceptive counseling for IUD Resume all normal activities Follow up in: 3 months for PAP or as needed.   Annamarie Major, MD, [facility], [facility] 09/07/2018  4:31 PM

## 2018-09-07 NOTE — Patient Instructions (Signed)

## 2018-10-04 ENCOUNTER — Other Ambulatory Visit: Payer: Self-pay | Admitting: Obstetrics and Gynecology

## 2018-10-04 MED ORDER — PRENATE PIXIE 10-0.6-0.4-200 MG PO CAPS: 1.0000 | ORAL_CAPSULE | Freq: Every day | ORAL | 11 refills | Status: DC

## 2019-01-11 ENCOUNTER — Ambulatory Visit: Payer: Medicaid Other | Admitting: Obstetrics and Gynecology

## 2019-01-19 ENCOUNTER — Encounter: Payer: Self-pay | Admitting: Obstetrics and Gynecology

## 2019-01-19 ENCOUNTER — Other Ambulatory Visit (HOSPITAL_COMMUNITY)
Admission: RE | Admit: 2019-01-19 | Discharge: 2019-01-19 | Disposition: A | Discharge status: Home / Self Care | Payer: Medicaid Other | Source: Ambulatory Visit | Attending: Obstetrics and Gynecology | Admitting: Obstetrics and Gynecology

## 2019-01-19 ENCOUNTER — Ambulatory Visit (INDEPENDENT_AMBULATORY_CARE_PROVIDER_SITE_OTHER): Payer: Medicaid Other | Admitting: Obstetrics and Gynecology

## 2019-01-19 ENCOUNTER — Other Ambulatory Visit: Payer: Self-pay

## 2019-01-19 VITALS — BP 100/80 | Ht 62.0 in | Wt 137.0 lb

## 2019-01-19 DIAGNOSIS — Z01419 Encounter for gynecological examination (general) (routine) without abnormal findings: Secondary | ICD-10-CM | POA: Diagnosis not present

## 2019-01-19 DIAGNOSIS — Z0000 Encounter for general adult medical examination without abnormal findings: Secondary | ICD-10-CM | POA: Diagnosis not present

## 2019-01-19 DIAGNOSIS — Z1339 Encounter for screening examination for other mental health and behavioral disorders: Secondary | ICD-10-CM

## 2019-01-19 DIAGNOSIS — K8050 Calculus of bile duct without cholangitis or cholecystitis without obstruction: Secondary | ICD-10-CM

## 2019-01-19 DIAGNOSIS — Z1331 Encounter for screening for depression: Secondary | ICD-10-CM

## 2019-01-19 DIAGNOSIS — Z124 Encounter for screening for malignant neoplasm of cervix: Secondary | ICD-10-CM

## 2019-01-19 NOTE — Progress Notes (Signed)
Gynecology Annual Exam  PCP: Patient, No Pcp Per  Chief Complaint  Patient presents with  . Gynecologic Exam    no concerns   History of Present Illness:  [name] is a 43 y.o. G3P3003 who LMP was No LMP recorded., presents today for her annual examination.  Her menses are absent due to breastfeeding.   She continues to have breastfeeds.   She is sexually active. She does not have vaginal dryness.  Last Pap: 1 year  Results were: no abnormalities /neg HPV DNA negative Hx of STDs: HPV  Last mammogram: not done There is no FH of breast cancer. There is no FH of ovarian cancer. The patient does not do self-breast exams.  Colonoscopy: not due DEXA: has not been screened for osteoporosis  Tobacco use: The patient denies current or previous tobacco use. Alcohol use: social drinker Exercise: not active  The patient wears seatbelts: yes.     She notes pain in her RUQ that wakes her in the night sometimes and does hurt her during the day after she has eaten fatty or spicy foods. She has tried teas to help with the pain and these do work.  She notes that she did have some difficulty with this pain during her pregnancy too.  Her dad recently has his gallbladder removed for similar symptoms.  She is concerned about her gallbladder.   Past Medical History:  Diagnosis Date  . Abnormal Pap smear of cervix   . UTI (lower urinary tract infection) 11-2015   resolved per pt and is to f/u with dr Jean Rosenthal prior to upcoming surgery    Past Surgical History:  Procedure Laterality Date  . BREAST ENHANCEMENT SURGERY  2008  . CERVICAL BIOPSY  W/ LOOP ELECTRODE EXCISION    . CERVICAL CONIZATION W/BX N/A 11/25/2015   Procedure: CONIZATION CERVIX WITH BIOPSY;  Surgeon: Conard Novak, MD;  Location: [facility] ORS;  Service: Gynecology;  Laterality: N/A;  . NO PAST SURGERIES      Prior to Admission medications   Medication Sig Start Date End Date Taking? Authorizing Provider   Prenat-FeAsp-Meth-FA-DHA w/o A (PRENATE PIXIE) 10-0.6-0.4-200 MG CAPS Take 1 capsule by mouth daily. 10/04/18  Yes Conard Novak, MD   Allergies: No Known Allergies  Obstetric History: [id number]  Family History  Problem Relation Age of Onset  . Uterine cancer Sister 28       cured  . Colon cancer Maternal Grandmother     Social History   Socioeconomic History  . Marital status: Single    Spouse name: Not on file  . Number of children: Not on file  . Years of education: Not on file  . Highest education level: Not on file  Occupational History  . Not on file  Social Needs  . Financial resource strain: Not on file  . Food insecurity    Worry: Not on file    Inability: Not on file  . Transportation needs    Medical: Not on file    Non-medical: Not on file  Tobacco Use  . Smoking status: Never Smoker  . Smokeless tobacco: Never Used  Substance and Sexual Activity  . Alcohol use: Yes    Comment: OCC  . Drug use: No  . Sexual activity: Yes    Birth control/protection: None  Lifestyle  . Physical activity    Days per week: Not on file    Minutes per session: Not on file  . Stress: Not on file  Relationships  . Social Musicianconnections    Talks on phone: Not on file    Gets together: Not on file    Attends religious service: Not on file    Active member of club or organization: Not on file    Attends meetings of clubs or organizations: Not on file    Relationship status: Not on file  . Intimate partner violence    Fear of current or ex partner: Not on file    Emotionally abused: Not on file    Physically abused: Not on file    Forced sexual activity: Not on file  Other Topics Concern  . Not on file  Social History Narrative  . Not on file    Review of Systems  Constitutional: Negative.   HENT: Negative.   Eyes: Negative.   Respiratory: Negative.   Cardiovascular: Negative.   Gastrointestinal: Negative.   Genitourinary: Negative.   Musculoskeletal: Negative.    Skin: Negative.   Neurological: Negative.   Psychiatric/Behavioral: Negative.       Physical Exam BP 100/80   Ht 5\' 2"  (1.575 m)   Wt 137 lb (62.1 kg)   Breastfeeding Yes   BMI 25.06 kg/m   Physical Exam Constitutional:      General: She is not in acute distress.    Appearance: Normal appearance. She is well-developed.  Genitourinary:     Pelvic exam was performed with patient supine.     Vulva, urethra, bladder and uterus normal.     No inguinal adenopathy present in the right or left side.    No signs of injury in the vagina.     No vaginal discharge, erythema, tenderness or bleeding.     No cervical motion tenderness, discharge, lesion or polyp.     Uterus is mobile.     Uterus is not enlarged or tender.     No uterine mass detected.    Uterus is anteverted.     No right or left adnexal mass present.     Right adnexa not tender or full.     Left adnexa not tender or full.  HENT:     Head: Normocephalic and atraumatic.  Eyes:     General: No scleral icterus.    Conjunctiva/sclera: Conjunctivae normal.  Neck:     Musculoskeletal: Normal range of motion and neck supple.     Thyroid: No thyromegaly.  Cardiovascular:     Rate and Rhythm: Normal rate and regular rhythm.     Heart sounds: No murmur. No friction rub. No gallop.   Pulmonary:     Effort: Pulmonary effort is normal. No respiratory distress.     Breath sounds: Normal breath sounds. No wheezing or rales.  Chest:     Breasts:        Right: No inverted nipple, mass, nipple discharge, skin change or tenderness.        Left: No inverted nipple, mass, nipple discharge, skin change or tenderness.  Abdominal:     General: Bowel sounds are normal. There is no distension.     Palpations: Abdomen is soft. There is no mass.     Tenderness: There is abdominal tenderness (RUQ, mildly tender). There is no guarding or rebound.  Musculoskeletal: Normal range of motion.        General: No swelling or tenderness.   Lymphadenopathy:     Cervical: No cervical adenopathy.     Lower Body: No right inguinal adenopathy. No left inguinal adenopathy.  Neurological:  General: No focal deficit present.     Mental Status: She is alert and oriented to person, place, and time.     Cranial Nerves: No cranial nerve deficit.  Skin:    General: Skin is warm and dry.     Findings: No erythema or rash.  Psychiatric:        Mood and Affect: Mood normal.        Behavior: Behavior normal.        Judgment: Judgment normal.     Female chaperone present for pelvic and breast  portions of the physical exam  Results: AUDIT Questionnaire (screen for alcoholism): 1 PHQ-9: 0  Assessment: 43 y.o. H4V4259 here for routine gynecologic annual examination  Plan: Problem List Items Addressed This Visit    None    Visit Diagnoses    Women's annual routine gynecological examination    -  Primary   Relevant Orders   Cytology - PAP   US ABDOMEN LIMITED RUQ   Hepatic function panel   CBC   Screening for depression       Screening for alcoholism       Pap smear for cervical cancer screening       Relevant Orders   Cytology - PAP   Biliary colic       Relevant Orders   US ABDOMEN LIMITED RUQ   Hepatic function panel   CBC     Screening: -- Blood pressure screen normal -- Colonoscopy - not due -- Mammogram - due, but will wait until patient stops breastfeeding -- Weight screening: normal -- Depression screening negative (PHQ-9) -- Nutrition: normal -- cholesterol screening: not due for screening -- osteoporosis screening: not due -- tobacco screening: not using -- alcohol screening: AUDIT questionnaire indicates low-risk usage. -- family history of breast cancer screening: done. not at high risk. -- no evidence of domestic violence or intimate partner violence. -- STD screening: gonorrhea/chlamydia NAAT not collected per patient request. -- pap smear collected per ASCCP guidelines  Biliary Colic:  LFTs,  CBC, RUQ ultrasound.   Contraceptive management: considering IUD (Copper) vs pills vs surgery. Will consider.   Prentice Docker, MD 01/19/2019 3:12 PM

## 2019-01-20 LAB — CBC
Hematocrit: 40.8 % (ref 34.0–46.6)
Hemoglobin: 13.4 g/dL (ref 11.1–15.9)
MCH: 27.1 pg (ref 26.6–33.0)
MCHC: 32.8 g/dL (ref 31.5–35.7)
MCV: 82 fL (ref 79–97)
Platelets: 360 10*3/uL (ref 150–450)
RBC: 4.95 x10E6/uL (ref 3.77–5.28)
RDW: 13.1 % (ref 11.7–15.4)
WBC: 7.9 10*3/uL (ref 3.4–10.8)

## 2019-01-20 LAB — HEPATIC FUNCTION PANEL
ALT: 19 IU/L (ref 0–32)
AST: 17 IU/L (ref 0–40)
Albumin: 4.8 g/dL (ref 3.8–4.8)
Alkaline Phosphatase: 142 IU/L — ABNORMAL HIGH (ref 39–117)
Bilirubin Total: <0.2 mg/dL (ref 0.0–1.2)
Bilirubin, Direct: 0.06 mg/dL (ref 0.00–0.40)
Total Protein: 7.9 g/dL (ref 6.0–8.5)

## 2019-01-24 LAB — CYTOLOGY - PAP
Diagnosis: NEGATIVE
HPV: NOT DETECTED

## 2019-01-29 ENCOUNTER — Other Ambulatory Visit: Payer: Self-pay

## 2019-01-29 ENCOUNTER — Ambulatory Visit
Admission: RE | Admit: 2019-01-29 | Discharge: 2019-01-29 | Disposition: A | Discharge status: Home / Self Care | Payer: Medicaid Other | Source: Ambulatory Visit | Attending: Obstetrics and Gynecology | Admitting: Obstetrics and Gynecology

## 2019-01-29 DIAGNOSIS — Z01419 Encounter for gynecological examination (general) (routine) without abnormal findings: Secondary | ICD-10-CM | POA: Diagnosis present

## 2019-01-29 DIAGNOSIS — K8050 Calculus of bile duct without cholangitis or cholecystitis without obstruction: Secondary | ICD-10-CM | POA: Diagnosis present

## 2019-07-03 ENCOUNTER — Other Ambulatory Visit: Payer: Self-pay | Admitting: Obstetrics and Gynecology

## 2019-07-03 DIAGNOSIS — R05 Cough: Secondary | ICD-10-CM

## 2019-07-03 DIAGNOSIS — R059 Cough, unspecified: Secondary | ICD-10-CM

## 2019-07-03 DIAGNOSIS — O0990 Supervision of high risk pregnancy, unspecified, unspecified trimester: Secondary | ICD-10-CM

## 2019-07-03 DIAGNOSIS — O09529 Supervision of elderly multigravida, unspecified trimester: Secondary | ICD-10-CM

## 2020-02-08 ENCOUNTER — Ambulatory Visit: Payer: Medicaid Other | Admitting: Obstetrics and Gynecology

## 2020-02-29 ENCOUNTER — Ambulatory Visit (INDEPENDENT_AMBULATORY_CARE_PROVIDER_SITE_OTHER): Payer: Medicaid Other | Admitting: Obstetrics and Gynecology

## 2020-02-29 ENCOUNTER — Other Ambulatory Visit: Payer: Self-pay

## 2020-02-29 ENCOUNTER — Other Ambulatory Visit (HOSPITAL_COMMUNITY)
Admission: RE | Admit: 2020-02-29 | Discharge: 2020-02-29 | Disposition: A | Discharge status: Home / Self Care | Payer: Medicaid Other | Source: Ambulatory Visit | Attending: Obstetrics and Gynecology | Admitting: Obstetrics and Gynecology

## 2020-02-29 ENCOUNTER — Encounter: Payer: Self-pay | Admitting: Obstetrics and Gynecology

## 2020-02-29 VITALS — BP 102/64 | Ht 63.0 in | Wt 144.0 lb

## 2020-02-29 DIAGNOSIS — Z0000 Encounter for general adult medical examination without abnormal findings: Secondary | ICD-10-CM

## 2020-02-29 DIAGNOSIS — Z01419 Encounter for gynecological examination (general) (routine) without abnormal findings: Secondary | ICD-10-CM

## 2020-02-29 DIAGNOSIS — Z1339 Encounter for screening examination for other mental health and behavioral disorders: Secondary | ICD-10-CM

## 2020-02-29 DIAGNOSIS — N920 Excessive and frequent menstruation with regular cycle: Secondary | ICD-10-CM

## 2020-02-29 DIAGNOSIS — Z1331 Encounter for screening for depression: Secondary | ICD-10-CM | POA: Diagnosis not present

## 2020-02-29 DIAGNOSIS — D069 Carcinoma in situ of cervix, unspecified: Secondary | ICD-10-CM | POA: Insufficient documentation

## 2020-02-29 NOTE — Progress Notes (Signed)
Gynecology Annual Exam  PCP: Patient, No Pcp Per  Chief Complaint  Patient presents with  . Gynecologic Exam    periods last a week or longer    History of Present Illness:  [name] is a 44 y.o. G3P3003 who LMP was Patient's last menstrual period was 02/17/2020., presents today for her annual examination.  Her menses are regular every 28-30 days, lasting 7 day(s).  Dysmenorrhea severe, occurring throughout menses. She does not have intermenstrual bleeding.  She is sexually active. She uses condoms for contraception  Last Pap: 1 year  Results were: no abnormalities /neg HPV DNA negative. History of CIN III, status post LEEP/CKC Hx of STDs: HPV  Last mammogram:  Never had There is no FH of breast cancer. There is no FH of ovarian cancer. The patient does not do self-breast exams.  Colonoscopy: June 2021 DEXA: has not been screened for osteoporosis  Tobacco use: The patient denies current or previous tobacco use. Alcohol use: social drinker Exercise: no  The patient wears seatbelts: yes.     Past Medical History:  Diagnosis Date  . Abnormal Pap smear of cervix   . UTI (lower urinary tract infection) 11-2015   resolved per pt and is to f/u with dr Jean Rosenthal prior to upcoming surgery    Past Surgical History:  Procedure Laterality Date  . BREAST ENHANCEMENT SURGERY  2008  . CERVICAL BIOPSY  W/ LOOP ELECTRODE EXCISION    . CERVICAL CONIZATION W/BX N/A 11/25/2015   Procedure: CONIZATION CERVIX WITH BIOPSY;  Surgeon: Conard Novak, MD;  Location: [facility] ORS;  Service: Gynecology;  Laterality: N/A;  . NO PAST SURGERIES      Prior to Admission medications   Medication Sig Start Date End Date Taking? Authorizing Provider  Prenat-FeAsp-Meth-FA-DHA w/o A (PRENATE PIXIE) 10-0.6-0.4-200 MG CAPS Take 1 capsule by mouth daily. 10/04/18  Yes Conard Novak, MD    No Known Allergies  Obstetric History: [phone]  Family History  Problem Relation Age of Onset  . Uterine  cancer Sister 28       cured  . Colon cancer Maternal Grandmother     Social History   Socioeconomic History  . Marital status: Single    Spouse name: Not on file  . Number of children: Not on file  . Years of education: Not on file  . Highest education level: Not on file  Occupational History  . Not on file  Tobacco Use  . Smoking status: Never Smoker  . Smokeless tobacco: Never Used  Vaping Use  . Vaping Use: Never used  Substance and Sexual Activity  . Alcohol use: Yes    Comment: OCC  . Drug use: No  . Sexual activity: Yes    Birth control/protection: None  Other Topics Concern  . Not on file  Social History Narrative  . Not on file   Social Determinants of Health   Financial Resource Strain:   . Difficulty of Paying Living Expenses: Not on file  Food Insecurity:   . Worried About Programme researcher, broadcasting/film/video in the Last Year: Not on file  . Ran Out of Food in the Last Year: Not on file  Transportation Needs:   . Lack of Transportation (Medical): Not on file  . Lack of Transportation (Non-Medical): Not on file  Physical Activity:   . Days of Exercise per Week: Not on file  . Minutes of Exercise per Session: Not on file  Stress:   . Feeling of  Stress : Not on file  Social Connections:   . Frequency of Communication with Friends and Family: Not on file  . Frequency of Social Gatherings with Friends and Family: Not on file  . Attends Religious Services: Not on file  . Active Member of Clubs or Organizations: Not on file  . Attends Banker Meetings: Not on file  . Marital Status: Not on file  Intimate Partner Violence:   . Fear of Current or Ex-Partner: Not on file  . Emotionally Abused: Not on file  . Physically Abused: Not on file  . Sexually Abused: Not on file    Review of Systems  Constitutional: Positive for malaise/fatigue. Negative for chills, diaphoresis, fever and weight loss.  HENT: Negative.   Eyes: Negative.   Respiratory: Negative.    Cardiovascular: Negative.   Gastrointestinal: Negative.   Genitourinary: Negative.   Musculoskeletal: Negative.   Skin: Negative.   Neurological: Negative.   Psychiatric/Behavioral: Negative.      Physical Exam BP 102/64   Ht 5\' 3"  (1.6 m)   Wt 144 lb (65.3 kg)   LMP 02/17/2020   BMI 25.51 kg/m   Physical Exam Constitutional:      General: She is not in acute distress.    Appearance: Normal appearance. She is well-developed.  Genitourinary:     Pelvic exam was performed with patient in the lithotomy position.     Vulva, urethra, bladder and uterus normal.     No inguinal adenopathy present in the right or left side.    No signs of injury in the vagina.     No vaginal discharge, erythema, tenderness or bleeding.     No cervical motion tenderness, discharge, lesion or polyp.     Uterus is mobile.     Uterus is not enlarged or tender.     No uterine mass detected.    Uterus is anteverted.     No right or left adnexal mass present.     Right adnexa not tender or full.     Left adnexa not tender or full.  HENT:     Head: Normocephalic and atraumatic.  Eyes:     General: No scleral icterus.    Conjunctiva/sclera: Conjunctivae normal.  Neck:     Thyroid: No thyromegaly.  Cardiovascular:     Rate and Rhythm: Normal rate and regular rhythm.     Heart sounds: No murmur heard.  No friction rub. No gallop.   Pulmonary:     Effort: Pulmonary effort is normal. No respiratory distress.     Breath sounds: Normal breath sounds. No wheezing or rales.  Chest:     Breasts:        Right: No inverted nipple, mass, nipple discharge, skin change or tenderness.        Left: No inverted nipple, mass, nipple discharge, skin change or tenderness.  Abdominal:     General: Bowel sounds are normal. There is no distension.     Palpations: Abdomen is soft. There is no mass.     Tenderness: There is no abdominal tenderness. There is no guarding or rebound.  Musculoskeletal:        General:  No swelling or tenderness. Normal range of motion.     Cervical back: Normal range of motion and neck supple.  Lymphadenopathy:     Cervical: No cervical adenopathy.     Lower Body: No right inguinal adenopathy. No left inguinal adenopathy.  Neurological:     General: No  focal deficit present.     Mental Status: She is alert and oriented to person, place, and time.     Cranial Nerves: No cranial nerve deficit.  Skin:    General: Skin is warm and dry.     Findings: No erythema or rash.  Psychiatric:        Mood and Affect: Mood normal.        Behavior: Behavior normal.        Judgment: Judgment normal.     Female chaperone present for pelvic and breast  portions of the physical exam  Results: AUDIT Questionnaire (screen for alcoholism): 1 PHQ-9: 3  Assessment: 44 y.o. G60P3003 female here for routine gynecologic examination.  Plan: Problem List Items Addressed This Visit      Genitourinary   CIN III (cervical intraepithelial neoplasia grade III) with severe dysplasia   Relevant Orders   Cytology - PAP     Other   Menorrhagia    Other Visit Diagnoses    Women's annual routine gynecological examination    -  Primary   Relevant Orders   CBC with Differential/Platelet   Cytology - PAP   Screening for depression       Screening for alcoholism       Laboratory examination ordered as part of a routine general medical examination       Relevant Orders   CBC with Differential/Platelet      Screening: -- Blood pressure screen normal -- Colonoscopy - not due -- Mammogram - due. Patient to call [facility] to arrange. She understands that it is her responsibility to arrange this. -- Weight screening: normal -- Depression screening negative (PHQ-9) -- Nutrition: normal -- cholesterol screening: not due for screening -- osteoporosis screening: not due -- tobacco screening: not using -- alcohol screening: AUDIT questionnaire indicates low-risk usage. -- family history of  breast cancer screening: done. not at high risk. -- no evidence of domestic violence or intimate partner violence. -- STD screening: gonorrhea/chlamydia NAAT not collected per patient request. -- pap smear collected per ASCCP guidelines -- flu vaccine: declines -- She has received the COVID19 vaccine.  Menorrhagia with regular cycle: will check CBC today.  Discussed options of doing nothing at this time, treating with medication (NSAIDs, hormonal medication, etc), surgery.  She would like to see how things go. Will start with an ultrasound if continues or gets worse.   Thomasene Mohair, MD 02/29/2020 3:04 PM

## 2020-03-01 LAB — CBC WITH DIFFERENTIAL/PLATELET
Basophils Absolute: 0.1 10*3/uL (ref 0.0–0.2)
Basos: 1 %
EOS (ABSOLUTE): 0.2 10*3/uL (ref 0.0–0.4)
Eos: 2 %
Hematocrit: 40.6 % (ref 34.0–46.6)
Hemoglobin: 13.2 g/dL (ref 11.1–15.9)
Immature Grans (Abs): 0 10*3/uL (ref 0.0–0.1)
Immature Granulocytes: 0 %
Lymphocytes Absolute: 3.7 10*3/uL — ABNORMAL HIGH (ref 0.7–3.1)
Lymphs: 43 %
MCH: 27.9 pg (ref 26.6–33.0)
MCHC: 32.5 g/dL (ref 31.5–35.7)
MCV: 86 fL (ref 79–97)
Monocytes Absolute: 0.8 10*3/uL (ref 0.1–0.9)
Monocytes: 9 %
Neutrophils Absolute: 3.8 10*3/uL (ref 1.4–7.0)
Neutrophils: 45 %
Platelets: 324 10*3/uL (ref 150–450)
RBC: 4.73 x10E6/uL (ref 3.77–5.28)
RDW: 12.4 % (ref 11.7–15.4)
WBC: 8.5 10*3/uL (ref 3.4–10.8)

## 2020-03-04 LAB — CYTOLOGY - PAP
Comment: NEGATIVE
Diagnosis: NEGATIVE
High risk HPV: NEGATIVE

## 2021-03-02 ENCOUNTER — Encounter: Payer: Self-pay | Admitting: Obstetrics and Gynecology

## 2021-03-02 ENCOUNTER — Ambulatory Visit (INDEPENDENT_AMBULATORY_CARE_PROVIDER_SITE_OTHER): Payer: Medicaid Other | Admitting: Obstetrics and Gynecology

## 2021-03-02 ENCOUNTER — Other Ambulatory Visit: Payer: Self-pay

## 2021-03-02 VITALS — BP 122/74 | Ht 63.0 in | Wt 156.0 lb

## 2021-03-02 DIAGNOSIS — Z1339 Encounter for screening examination for other mental health and behavioral disorders: Secondary | ICD-10-CM

## 2021-03-02 DIAGNOSIS — Z01419 Encounter for gynecological examination (general) (routine) without abnormal findings: Secondary | ICD-10-CM

## 2021-03-02 DIAGNOSIS — Z0000 Encounter for general adult medical examination without abnormal findings: Secondary | ICD-10-CM

## 2021-03-02 DIAGNOSIS — Z1331 Encounter for screening for depression: Secondary | ICD-10-CM | POA: Diagnosis not present

## 2021-03-02 DIAGNOSIS — R238 Other skin changes: Secondary | ICD-10-CM

## 2021-03-02 NOTE — Progress Notes (Signed)
Gynecology Annual Exam  PCP: Patient, No Pcp Per (Inactive)  Chief Complaint  Patient presents with   Annual Exam   History of Present Illness:  Ms. Shakyra Mattera is a 45 y.o. G3P3003 who LMP was Patient's last menstrual period was 02/25/2021 (exact date)., presents today for her annual examination.  Her menses are regular every 28-30 days, lasting 7 day(s).  Dysmenorrhea less severe, occurring throughout menses. She does not have intermenstrual bleeding.  She is sexually active. She uses condoms for contraception  Last Pap: 1 year  Results were: no abnormalities /neg HPV DNA negative. History of CIN III, status post LEEP/CKC She has had 3 consecutive negative pap smears with no HPV. Hx of STDs: HPV  Last mammogram:  Never had There is no FH of breast cancer. There is no FH of ovarian cancer. The patient does do self-breast exams.  Colonoscopy: June 2021 DEXA: has not been screened for osteoporosis  Tobacco use: The patient denies current or previous tobacco use. Alcohol use: social drinker Exercise: no  The patient wears seatbelts: yes.     She had a GI appt for loose, dark stools.  She had a negative workup.  She was asked to get a CBC.    She also has been having some skin changes all over with dryness and redness and requests a referral to dermatology.    Past Medical History:  Diagnosis Date   Abnormal Pap smear of cervix    UTI (lower urinary tract infection) 11-2015   resolved per pt and is to f/u with dr Jean Rosenthal prior to upcoming surgery    Past Surgical History:  Procedure Laterality Date   BREAST ENHANCEMENT SURGERY  2008   CERVICAL BIOPSY  W/ LOOP ELECTRODE EXCISION     CERVICAL CONIZATION W/BX N/A 11/25/2015   Procedure: CONIZATION CERVIX WITH BIOPSY;  Surgeon: Conard Novak, MD;  Location: [facility] ORS;  Service: Gynecology;  Laterality: N/A;    Prior to Admission medications   Medication Sig Start Date End Date Taking? Authorizing Provider   Prenat-FeAsp-Meth-FA-DHA w/o A (PRENATE PIXIE) 10-0.6-0.4-200 MG CAPS Take 1 capsule by mouth daily. 10/04/18  Yes Conard Novak, MD  Omeprazole  Allergies:  No Known Allergies  Obstetric History: [id number]  Family History  Problem Relation Age of Onset   Uterine cancer Sister 24       cured   Colon cancer Maternal Grandmother     Social History   Socioeconomic History   Marital status: Single    Spouse name: Not on file   Number of children: Not on file   Years of education: Not on file   Highest education level: Not on file  Occupational History   Not on file  Tobacco Use   Smoking status: Never   Smokeless tobacco: Never  Vaping Use   Vaping Use: Never used  Substance and Sexual Activity   Alcohol use: Yes    Comment: OCC   Drug use: No   Sexual activity: Yes    Birth control/protection: None  Other Topics Concern   Not on file  Social History Narrative   Not on file   Social Determinants of Health   Financial Resource Strain: Not on file  Food Insecurity: Not on file  Transportation Needs: Not on file  Physical Activity: Not on file  Stress: Not on file  Social Connections: Not on file  Intimate Partner Violence: Not on file    Review of Systems  Constitutional:  Positive for  malaise/fatigue. Negative for chills, diaphoresis, fever and weight loss.  HENT: Negative.    Eyes: Negative.   Respiratory: Negative.    Cardiovascular: Negative.   Gastrointestinal: Negative.   Genitourinary: Negative.   Musculoskeletal: Negative.   Skin: Negative.   Neurological: Negative.   Psychiatric/Behavioral: Negative.      Physical Exam BP 122/74   Ht 5\' 3"  (1.6 m)   Wt 156 lb (70.8 kg)   LMP 02/25/2021 (Exact Date)   BMI 27.63 kg/m   Physical Exam Constitutional:      General: She is not in acute distress.    Appearance: Normal appearance. She is well-developed.  Genitourinary:     Vulva and bladder normal.     No vaginal discharge, erythema,  tenderness or bleeding.      Right Adnexa: not tender, not full and no mass present.    Left Adnexa: not tender, not full and no mass present.    No cervical motion tenderness, discharge, lesion or polyp.     Uterus is not enlarged or tender.     No uterine mass detected.    Pelvic exam was performed with patient in the lithotomy position.  Breasts:    Right: No inverted nipple, mass, nipple discharge, skin change or tenderness.     Left: No inverted nipple, mass, nipple discharge, skin change or tenderness.  HENT:     Head: Normocephalic and atraumatic.  Eyes:     General: No scleral icterus.    Conjunctiva/sclera: Conjunctivae normal.  Neck:     Thyroid: No thyromegaly.  Cardiovascular:     Rate and Rhythm: Normal rate and regular rhythm.     Heart sounds: No murmur heard.   No friction rub. No gallop.  Pulmonary:     Effort: Pulmonary effort is normal. No respiratory distress.     Breath sounds: Normal breath sounds. No wheezing or rales.  Abdominal:     General: Bowel sounds are normal. There is no distension.     Palpations: Abdomen is soft. There is no mass.     Tenderness: There is no abdominal tenderness. There is no guarding or rebound.  Musculoskeletal:        General: No swelling or tenderness. Normal range of motion.     Cervical back: Normal range of motion and neck supple.  Lymphadenopathy:     Cervical: No cervical adenopathy.     Lower Body: No right inguinal adenopathy. No left inguinal adenopathy.  Neurological:     General: No focal deficit present.     Mental Status: She is alert and oriented to person, place, and time.     Cranial Nerves: No cranial nerve deficit.  Skin:    General: Skin is warm and dry.     Findings: No erythema or rash.  Psychiatric:        Mood and Affect: Mood normal.        Behavior: Behavior normal.        Judgment: Judgment normal.    Female chaperone present for pelvic and breast  portions of the physical  exam  Results: AUDIT Questionnaire (screen for alcoholism): 1 PHQ-9: 1  Assessment: 45 y.o. G59P3003 female here for routine gynecologic examination.  Plan: Problem List Items Addressed This Visit   None Visit Diagnoses     Women's annual routine gynecological examination    -  Primary   Relevant Orders   CBC with Differential/Platelet   Ambulatory referral to Dermatology   Screening for depression  Screening for alcoholism       Blood tests for routine general physical examination       Relevant Orders   CBC with Differential/Platelet   Other skin changes       Relevant Orders   Ambulatory referral to Dermatology      Screening: -- Blood pressure screen normal -- Colonoscopy - not due -- Mammogram - due. Patient to call [facility] to arrange. She understands that it is her responsibility to arrange this. -- Weight screening: normal -- Depression screening negative (PHQ-9) -- Nutrition: normal -- cholesterol screening: not due for screening -- osteoporosis screening: not due -- tobacco screening: not using -- alcohol screening: AUDIT questionnaire indicates low-risk usage. -- family history of breast cancer screening: done. not at high risk. -- no evidence of domestic violence or intimate partner violence. -- STD screening: gonorrhea/chlamydia NAAT not collected per patient request. -- pap smear not collected per ASCCP guidelines. She will need a pap smear Q 3 years per ASCCP for CIN 3 and she has had 3 consecutive normal pap smears since.  -- flu vaccine: declines -- She has received the COVID19 vaccine.  Thomasene Mohair, MD 03/02/2021 3:07 PM

## 2021-03-02 NOTE — Patient Instructions (Signed)
[facility] at [facility] in [place], [place] Address: First Floor - [facility], [address], [place], [place] [address] Phone: [phone]  

## 2021-03-03 LAB — CBC WITH DIFFERENTIAL/PLATELET
Basophils Absolute: 0.1 10*3/uL (ref 0.0–0.2)
Basos: 1 %
EOS (ABSOLUTE): 0.3 10*3/uL (ref 0.0–0.4)
Eos: 4 %
Hematocrit: 40.2 % (ref 34.0–46.6)
Hemoglobin: 13.3 g/dL (ref 11.1–15.9)
Immature Grans (Abs): 0 10*3/uL (ref 0.0–0.1)
Immature Granulocytes: 0 %
Lymphocytes Absolute: 3.8 10*3/uL — ABNORMAL HIGH (ref 0.7–3.1)
Lymphs: 48 %
MCH: 27.7 pg (ref 26.6–33.0)
MCHC: 33.1 g/dL (ref 31.5–35.7)
MCV: 84 fL (ref 79–97)
Monocytes Absolute: 0.8 10*3/uL (ref 0.1–0.9)
Monocytes: 10 %
Neutrophils Absolute: 3 10*3/uL (ref 1.4–7.0)
Neutrophils: 37 %
Platelets: 359 10*3/uL (ref 150–450)
RBC: 4.8 x10E6/uL (ref 3.77–5.28)
RDW: 12.7 % (ref 11.7–15.4)
WBC: 7.9 10*3/uL (ref 3.4–10.8)

## 2021-07-08 ENCOUNTER — Encounter: Payer: Self-pay | Admitting: Obstetrics and Gynecology

## 2021-07-08 ENCOUNTER — Ambulatory Visit: Payer: Medicaid Other | Admitting: Obstetrics and Gynecology

## 2021-07-08 ENCOUNTER — Other Ambulatory Visit: Payer: Self-pay

## 2021-07-08 VITALS — BP 104/70 | Ht 62.0 in | Wt 156.0 lb

## 2021-07-08 DIAGNOSIS — N898 Other specified noninflammatory disorders of vagina: Secondary | ICD-10-CM

## 2021-07-08 DIAGNOSIS — N6459 Other signs and symptoms in breast: Secondary | ICD-10-CM

## 2021-07-08 DIAGNOSIS — Z1231 Encounter for screening mammogram for malignant neoplasm of breast: Secondary | ICD-10-CM

## 2021-07-08 MED ORDER — FLUCONAZOLE 150 MG PO TABS
150.0000 mg | ORAL_TABLET | Freq: Once | ORAL | 0 refills | Status: AC
Start: 2021-07-08 — End: 2021-07-08

## 2021-07-08 NOTE — Progress Notes (Signed)
Patient, No Pcp Per (Inactive)   Chief Complaint  Patient presents with   Breast Exam    Both breast x couple of days   Vaginal Discharge    Itching, no odor x 3 weeks    HPI:      [name] is a 46 y.o. G3P3003 whose LMP was Patient's last menstrual period was 06/17/2021 (approximate)., presents today for bilat breast burning sensation, L>R, for a few days. Pt with sensitive skin and has been using new shower gel on breasts recently. Also takes very hot showers. Wears sports bras, no dryer sheets.  No masses, no erythema, rash. Hasn't had mammo yet. Also with increased vag d/c and itching, no odor, for a few wks. No recent abx use, no meds to treat.  She is sex active, no new partners. Last pap 10/21 neg cells/neg HPV DNA.   Patient Active Problem List   Diagnosis Date Noted   Labor, precipitous 07/26/2018   Supervision of high risk pregnancy, antepartum 12/01/2017   History of conization of cervix 12/01/2017   Supervision of high-risk pregnancy of elderly multigravida 12/01/2017   ASCUS with positive high risk HPV cervical 02/22/2017   Dysmenorrhea 01/10/2017   Menorrhagia 01/10/2017   CIN III (cervical intraepithelial neoplasia grade III) with severe dysplasia 11/25/2015    Past Surgical History:  Procedure Laterality Date   BREAST ENHANCEMENT SURGERY  2008   CERVICAL BIOPSY  W/ LOOP ELECTRODE EXCISION     CERVICAL CONIZATION W/BX N/A 11/25/2015   Procedure: CONIZATION CERVIX WITH BIOPSY;  Surgeon: Conard Novak, MD;  Location: [facility] ORS;  Service: Gynecology;  Laterality: N/A;    Family History  Problem Relation Age of Onset   Uterine cancer Sister 48       cured   Colon cancer Maternal Grandmother     Social History   Socioeconomic History   Marital status: Single    Spouse name: Not on file   Number of children: Not on file   Years of education: Not on file   Highest education level: Not on file  Occupational History   Not on file  Tobacco  Use   Smoking status: Never   Smokeless tobacco: Never  Vaping Use   Vaping Use: Never used  Substance and Sexual Activity   Alcohol use: Yes    Comment: OCC   Drug use: No   Sexual activity: Yes    Birth control/protection: None  Other Topics Concern   Not on file  Social History Narrative   Not on file   Social Determinants of Health   Financial Resource Strain: Not on file  Food Insecurity: Not on file  Transportation Needs: Not on file  Physical Activity: Not on file  Stress: Not on file  Social Connections: Not on file  Intimate Partner Violence: Not on file    Outpatient Medications Prior to Visit  Medication Sig Dispense Refill   omeprazole (PRILOSEC) 40 MG capsule Take by mouth.     Prenat-FeAsp-Meth-FA-DHA w/o A (PRENATE PIXIE) 10-0.6-0.4-200 MG CAPS Take 1 capsule by mouth daily. (Patient not taking: Reported on 03/02/2021) 30 capsule 11   No facility-administered medications prior to visit.     ROS:  Review of Systems  Constitutional:  Negative for fever.  Gastrointestinal:  Negative for blood in stool, constipation, diarrhea, nausea and vomiting.  Genitourinary:  Positive for frequency and vaginal discharge. Negative for dyspareunia, dysuria, flank pain, hematuria, urgency, vaginal bleeding and vaginal pain.  Musculoskeletal:  Negative for back pain.  Skin:  Negative for rash.    OBJECTIVE:   Vitals:  BP 104/70    Ht 5\' 2"  (1.575 m)    Wt 156 lb (70.8 kg)    LMP 06/17/2021 (Approximate)    Breastfeeding No    BMI 28.53 kg/m   Physical Exam Vitals reviewed.  Constitutional:      Appearance: She is well-developed.  Pulmonary:     Effort: Pulmonary effort is normal.  Chest:  Breasts:    Breasts are symmetrical.     Right: No inverted nipple, mass, nipple discharge, skin change or tenderness.     Left: No inverted nipple, mass, nipple discharge, skin change or tenderness.     Comments: NEG BREAST EXAM; NO ERYTHEMA, RASHES Genitourinary:     Pubic Area: No rash.      Labia:        Right: No rash, tenderness or lesion.        Left: No rash, tenderness or lesion.      Vagina: Normal. No vaginal discharge, erythema or tenderness.     Cervix: Normal.     Uterus: Normal. Not enlarged and not tender.      Adnexa: Right adnexa normal and left adnexa normal.       Right: No mass or tenderness.         Left: No mass or tenderness.       Comments: MILD ERYTHEMA BILAT LABIA MINORA NEAR INTROITUS Musculoskeletal:        General: Normal range of motion.     Cervical back: Normal range of motion.  Skin:    General: Skin is warm and dry.  Neurological:     General: No focal deficit present.     Mental Status: She is alert and oriented to person, place, and time.     Cranial Nerves: No cranial nerve deficit.  Psychiatric:        Mood and Affect: Mood normal.        Behavior: Behavior normal.        Thought Content: Thought content normal.        Judgment: Judgment normal.    Results: Results for orders placed or performed in visit on 07/08/21 (from the past 24 hour(s))  POCT Wet Prep with KOH     Status: Normal   Collection Time: 07/09/21 11:04 AM  Result Value Ref Range   Trichomonas, UA Negative    Clue Cells Wet Prep HPF POC neg    Epithelial Wet Prep HPF POC     Yeast Wet Prep HPF POC neg    Bacteria Wet Prep HPF POC     RBC Wet Prep HPF POC     WBC Wet Prep HPF POC     KOH Prep POC Negative Negative     Assessment/Plan: Vaginal itching - Plan: fluconazole (DIFLUCAN) 150 MG tablet, POCT Wet Prep with KOH; neg wet prep, pos sx and exam. Rx diflucan. F/u prn. Dove sens skin soap vaginally.   Breast symptom--burning sensation of skin bilat with neg exam. D/C shower gel, use lotion after shower, double rinse bras. Most likely derm related. F/u prn.   Encounter for screening mammogram for malignant neoplasm of breast - Plan: MM 3D SCREEN BREAST BILATERAL; pt to schedule mammo since due   Meds ordered this encounter   Medications   fluconazole (DIFLUCAN) 150 MG tablet    Sig: Take 1 tablet (150 mg total) by mouth once for 1 dose.  Dispense:  1 tablet    Refill:  0    Order Specific Question:   Supervising Provider    Answer:   Gae Dry [id number]      Return if symptoms worsen or fail to improve.  Shikha Bibb B. Cailyn Houdek, PA-C 07/09/2021 11:05 AM

## 2021-07-09 ENCOUNTER — Other Ambulatory Visit: Payer: Self-pay | Admitting: Obstetrics and Gynecology

## 2021-07-09 ENCOUNTER — Encounter: Payer: Self-pay | Admitting: Obstetrics and Gynecology

## 2021-07-09 DIAGNOSIS — Z1231 Encounter for screening mammogram for malignant neoplasm of breast: Secondary | ICD-10-CM

## 2021-07-09 LAB — POCT WET PREP WITH KOH
Clue Cells Wet Prep HPF POC: NEGATIVE
KOH Prep POC: NEGATIVE
Trichomonas, UA: NEGATIVE
Yeast Wet Prep HPF POC: NEGATIVE

## 2021-07-10 ENCOUNTER — Ambulatory Visit
Admission: RE | Admit: 2021-07-10 | Discharge: 2021-07-10 | Disposition: A | Discharge status: Home / Self Care | Payer: Medicaid Other | Source: Ambulatory Visit | Attending: Obstetrics and Gynecology | Admitting: Obstetrics and Gynecology

## 2021-07-10 ENCOUNTER — Other Ambulatory Visit: Payer: Self-pay | Admitting: Obstetrics and Gynecology

## 2021-07-10 DIAGNOSIS — Z1231 Encounter for screening mammogram for malignant neoplasm of breast: Secondary | ICD-10-CM

## 2021-07-16 ENCOUNTER — Encounter: Payer: Self-pay | Admitting: Obstetrics and Gynecology

## 2021-07-19 NOTE — Telephone Encounter (Addendum)
Pls call pt to see if she can come in for appt today if still having sx.  ?

## 2021-07-23 ENCOUNTER — Ambulatory Visit: Payer: Medicaid Other | Admitting: Obstetrics and Gynecology

## 2021-07-23 ENCOUNTER — Other Ambulatory Visit: Payer: Self-pay

## 2021-07-23 ENCOUNTER — Encounter: Payer: Self-pay | Admitting: Obstetrics and Gynecology

## 2021-07-23 VITALS — BP 90/60 | Ht 63.0 in | Wt 158.0 lb

## 2021-07-23 DIAGNOSIS — R21 Rash and other nonspecific skin eruption: Secondary | ICD-10-CM | POA: Diagnosis not present

## 2021-07-23 DIAGNOSIS — N760 Acute vaginitis: Secondary | ICD-10-CM | POA: Diagnosis not present

## 2021-07-23 DIAGNOSIS — B3731 Acute candidiasis of vulva and vagina: Secondary | ICD-10-CM | POA: Diagnosis not present

## 2021-07-23 DIAGNOSIS — L309 Dermatitis, unspecified: Secondary | ICD-10-CM

## 2021-07-23 DIAGNOSIS — B9689 Other specified bacterial agents as the cause of diseases classified elsewhere: Secondary | ICD-10-CM | POA: Diagnosis not present

## 2021-07-23 DIAGNOSIS — R350 Frequency of micturition: Secondary | ICD-10-CM

## 2021-07-23 LAB — POCT URINALYSIS DIPSTICK
Bilirubin, UA: NEGATIVE
Blood, UA: NEGATIVE
Glucose, UA: NEGATIVE
Ketones, UA: NEGATIVE
Nitrite, UA: NEGATIVE
Protein, UA: NEGATIVE
Spec Grav, UA: 1.01 (ref 1.010–1.025)
pH, UA: 6 (ref 5.0–8.0)

## 2021-07-23 LAB — POCT WET PREP WITH KOH
Clue Cells Wet Prep HPF POC: POSITIVE
KOH Prep POC: POSITIVE — AB
Trichomonas, UA: NEGATIVE
Yeast Wet Prep HPF POC: POSITIVE

## 2021-07-23 MED ORDER — FLUCONAZOLE 150 MG PO TABS: 150.0000 mg | ORAL_TABLET | Freq: Once | ORAL | 0 refills | Status: AC

## 2021-07-23 NOTE — Patient Instructions (Signed)
I value your feedback and you entrusting us with your care. If you get a [facility] patient survey, I would appreciate you taking the time to let us know about your experience today. Thank you! ? ? ?

## 2021-07-23 NOTE — Progress Notes (Signed)
? ? ?Patient, No Pcp Per (Inactive) ? ? ?Chief Complaint  ?Patient presents with  ? Vaginal Itching  ?  Discharge, no odor  ? Urinary Tract Infection  ?  Urinary frequency, no burning  ? ? ?HPI: ?     [name] is a 46 y.o. G3P3003 whose LMP was Patient's last menstrual period was 06/28/2021 (approximate)., presents today for f/u from 07/08/21 appt. Had vaginal burning with neg wet prep, treated with diflucan without relief. Now with increased itching/irritation/discharge, no odor. Scratching at night. Has changed to dove sensitive skin soap, no dryer sheets, wearing cotton underwear, no thongs. No prior abx use. Also with urinary frequency with good flow, no hematuria, dysuria. Drinks 1 coffee daily, lots of water. She is sex active, no new partners. Last pap 10/21 neg cells/neg HPV DNA. ?Would also like derm ref. Has itchy papular rash on arms. Gets rashes and dry areas on face, rashes with lotions.  ? ?Patient Active Problem List  ? Diagnosis Date Noted  ? BV (bacterial vaginosis) 07/23/2021  ? Candidal vaginitis 07/23/2021  ? Labor, precipitous 07/26/2018  ? Supervision of high risk pregnancy, antepartum 12/01/2017  ? History of conization of cervix 12/01/2017  ? Supervision of high-risk pregnancy of elderly multigravida 12/01/2017  ? ASCUS with positive high risk HPV cervical 02/22/2017  ? Dysmenorrhea 01/10/2017  ? Menorrhagia 01/10/2017  ? CIN III (cervical intraepithelial neoplasia grade III) with severe dysplasia 11/25/2015  ? ? ?Past Surgical History:  ?Procedure Laterality Date  ? BREAST ENHANCEMENT SURGERY  2008  ? CERVICAL BIOPSY  W/ LOOP ELECTRODE EXCISION    ? CERVICAL CONIZATION W/BX N/A 11/25/2015  ? Procedure: CONIZATION CERVIX WITH BIOPSY;  Surgeon: Will Bonnet, MD;  Location: [facility] ORS;  Service: Gynecology;  Laterality: N/A;  ? ? ?Family History  ?Problem Relation Age of Onset  ? Uterine cancer Sister 44  ?     cured  ? Colon cancer Maternal Grandmother   ? ? ?Social History   ? ?Socioeconomic History  ? Marital status: Single  ?  Spouse name: Not on file  ? Number of children: Not on file  ? Years of education: Not on file  ? Highest education level: Not on file  ?Occupational History  ? Not on file  ?Tobacco Use  ? Smoking status: Never  ? Smokeless tobacco: Never  ?Vaping Use  ? Vaping Use: Never used  ?Substance and Sexual Activity  ? Alcohol use: Yes  ?  Comment: OCC  ? Drug use: No  ? Sexual activity: Not Currently  ?  Birth control/protection: None  ?Other Topics Concern  ? Not on file  ?Social History Narrative  ? Not on file  ? ?Social Determinants of Health  ? ?Financial Resource Strain: Not on file  ?Food Insecurity: Not on file  ?Transportation Needs: Not on file  ?Physical Activity: Not on file  ?Stress: Not on file  ?Social Connections: Not on file  ?Intimate Partner Violence: Not on file  ? ? ?No outpatient medications prior to visit.  ? ?No facility-administered medications prior to visit.  ? ? ? ? ?ROS: ? ?Review of Systems  ?Constitutional:  Negative for fever.  ?Gastrointestinal:  Negative for blood in stool, constipation, diarrhea, nausea and vomiting.  ?Genitourinary:  Positive for frequency and vaginal discharge. Negative for dyspareunia, dysuria, flank pain, hematuria, urgency, vaginal bleeding and vaginal pain.  ?Musculoskeletal:  Negative for back pain.  ?Skin:  Positive for rash.  ?BREAST: No symptoms ? ? ?  OBJECTIVE:  ? ?Vitals:  ?BP 90/60   Ht 5\' 3"  (1.6 m)   Wt 158 lb (71.7 kg)   LMP 06/28/2021 (Approximate)   BMI 27.99 kg/m?  ? ?Physical Exam ?Vitals reviewed.  ?Constitutional:   ?   Appearance: She is well-developed.  ?Pulmonary:  ?   Effort: Pulmonary effort is normal.  ?Genitourinary: ?   Pubic Area: No rash.   ?   Labia:     ?   Right: Tenderness present. No rash or lesion.     ?   Left: Tenderness present. No rash or lesion.   ?   Vagina: Vaginal discharge present. No erythema or tenderness.  ?   Cervix: Normal.  ?   Uterus: Normal. Not enlarged and  not tender.   ?   Adnexa: Right adnexa normal and left adnexa normal.    ?   Right: No mass or tenderness.      ?   Left: No mass or tenderness.    ?   Comments: BILAT LABIA MINORA WITH ERYTHEMA ?Musculoskeletal:     ?   General: Normal range of motion.  ?   Cervical back: Normal range of motion.  ?Skin: ?   General: Skin is warm and dry.  ?Neurological:  ?   General: No focal deficit present.  ?   Mental Status: She is alert and oriented to person, place, and time.  ?Psychiatric:     ?   Mood and Affect: Mood normal.     ?   Behavior: Behavior normal.     ?   Thought Content: Thought content normal.     ?   Judgment: Judgment normal.  ? ? ?Results: ?Results for orders placed or performed in visit on 07/23/21 (from the past 24 hour(s))  ?POCT Wet Prep with KOH     Status: Abnormal  ? Collection Time: 07/23/21 11:52 AM  ?Result Value Ref Range  ? Trichomonas, UA Negative   ? Clue Cells Wet Prep HPF POC pos   ? Epithelial Wet Prep HPF POC    ? Yeast Wet Prep HPF POC pos   ? Bacteria Wet Prep HPF POC    ? RBC Wet Prep HPF POC    ? WBC Wet Prep HPF POC    ? KOH Prep POC Positive (A) Negative  ?POCT Urinalysis Dipstick     Status: Abnormal  ? Collection Time: 07/23/21 11:52 AM  ?Result Value Ref Range  ? Color, UA yellow   ? Clarity, UA clear   ? Glucose, UA Negative Negative  ? Bilirubin, UA neg   ? Ketones, UA neg   ? Spec Grav, UA 1.010 1.010 - 1.025  ? Blood, UA neg   ? pH, UA 6.0 5.0 - 8.0  ? Protein, UA Negative Negative  ? Urobilinogen, UA    ? Nitrite, UA neg   ? Leukocytes, UA Moderate (2+) (A) Negative  ? Appearance    ? Odor    ? ? ? ?Assessment/Plan: ?Candidal vaginitis - Plan: fluconazole (DIFLUCAN) 150 MG tablet, POCT Wet Prep with KOH; pos sx and wet prep. Rx diflucan x 2. If sx persist, will Rx terazol. F/u prn.  ? ?BV (bacterial vaginosis) - Plan: POCT Wet Prep with KOH; pos wet prep. Rx flagyl, no EtOH. F/u prn.  ? ?Urinary frequency - Plan: POCT Urinalysis Dipstick, Urine Culture; no other UTI sx, with  good flow. Check C&S. Will f/u if pos.  ? ?Facial rash - Plan:  Ambulatory referral to Dermatology; ref to derm in [place] where pt works/lives. ? ?Dermatitis - Plan: Ambulatory referral to Dermatology ? ? ?Meds ordered this encounter  ?Medications  ? fluconazole (DIFLUCAN) 150 MG tablet  ?  Sig: Take 1 tablet (150 mg total) by mouth once for 1 dose. May repeat in 3 days if still having symptoms  ?  Dispense:  2 tablet  ?  Refill:  0  ?  Order Specific Question:   Supervising Provider  ?  AnswerGae Dry [id number]  ? ? ? ? Return if symptoms worsen or fail to improve. ? ?[employer] B. Rihana Kiddy, PA-C ?07/23/2021 ?11:54 AM ? ? ? ? ? ?

## 2021-07-25 ENCOUNTER — Other Ambulatory Visit: Payer: Self-pay | Admitting: Obstetrics and Gynecology

## 2021-07-25 LAB — URINE CULTURE

## 2021-07-25 MED ORDER — METRONIDAZOLE 500 MG PO TABS: ORAL_TABLET | ORAL | 0 refills | Status: AC

## 2021-07-25 NOTE — Progress Notes (Signed)
Rx flagyl for BV 

## 2022-07-28 ENCOUNTER — Other Ambulatory Visit: Payer: Self-pay | Admitting: Obstetrics and Gynecology

## 2022-07-28 ENCOUNTER — Ambulatory Visit
Admission: RE | Admit: 2022-07-28 | Discharge: 2022-07-28 | Disposition: A | Discharge status: Home / Self Care | Payer: Medicaid Other | Source: Ambulatory Visit | Attending: Obstetrics and Gynecology | Admitting: Obstetrics and Gynecology

## 2022-07-28 DIAGNOSIS — Z0000 Encounter for general adult medical examination without abnormal findings: Secondary | ICD-10-CM

## 2022-08-28 IMAGING — MG DIGITAL SCREENING BREAST BILAT IMPLANT W/ TOMO W/ CAD
8 of 12 series · 8 of 28 positions shown · non-contrast
Comparison: None.

CLINICAL DATA: Screening.

EXAM:
DIGITAL SCREENING BILATERAL MAMMOGRAM WITH IMPLANTS, CAD AND
TOMOSYNTHESIS
TECHNIQUE: Bilateral screening digital craniocaudal and mediolateral oblique
mammograms were obtained. Bilateral screening digital breast
tomosynthesis was performed. The images were evaluated with
computer-aided detection. Standard and/or implant displaced views
were performed.

[L MLO]
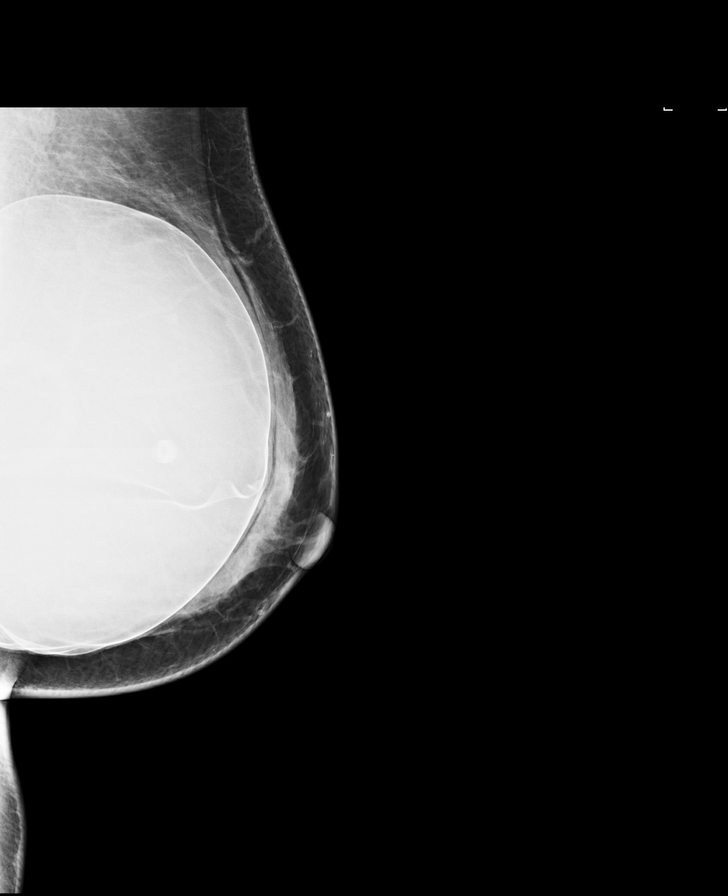

[R CC]
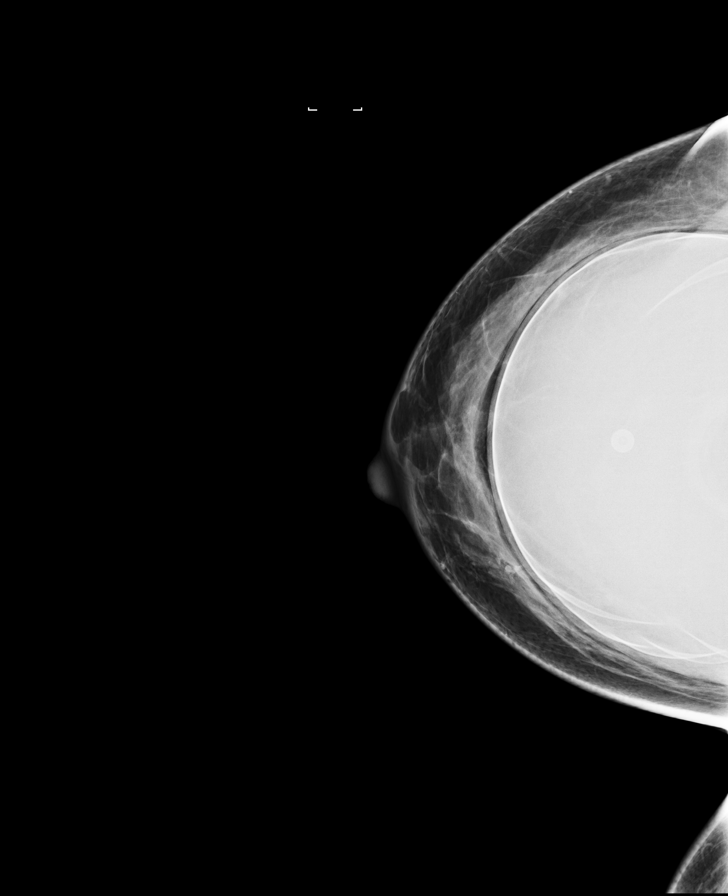

[L CC]
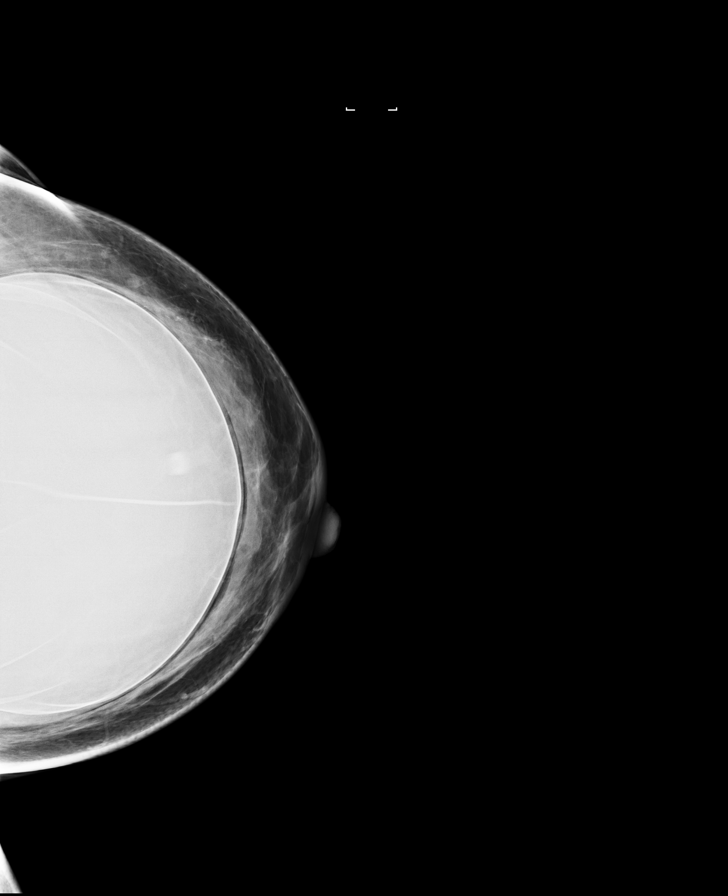

[R MLO]
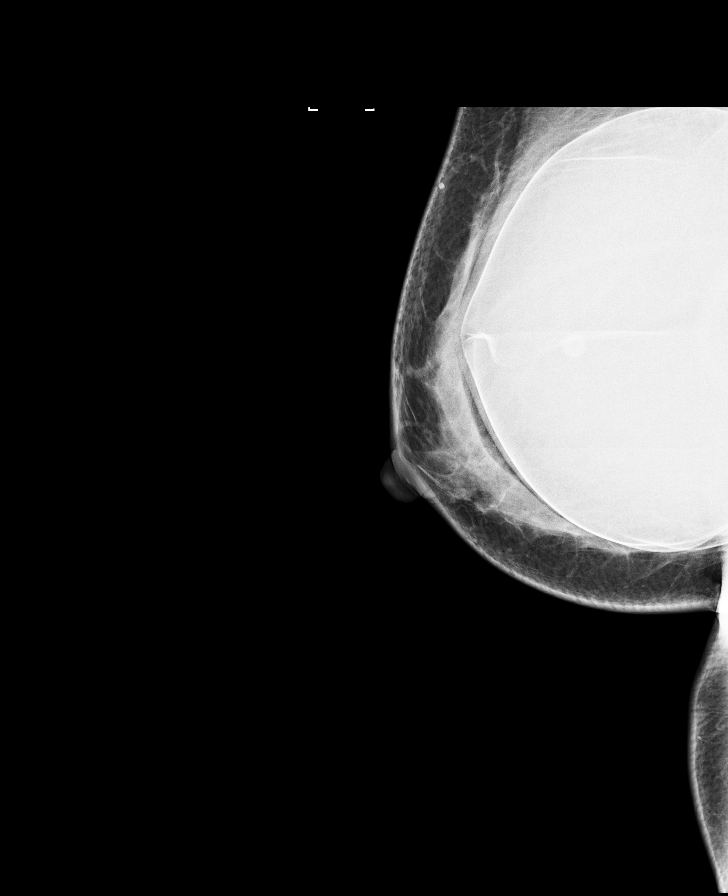

[R MLO synth-2D]
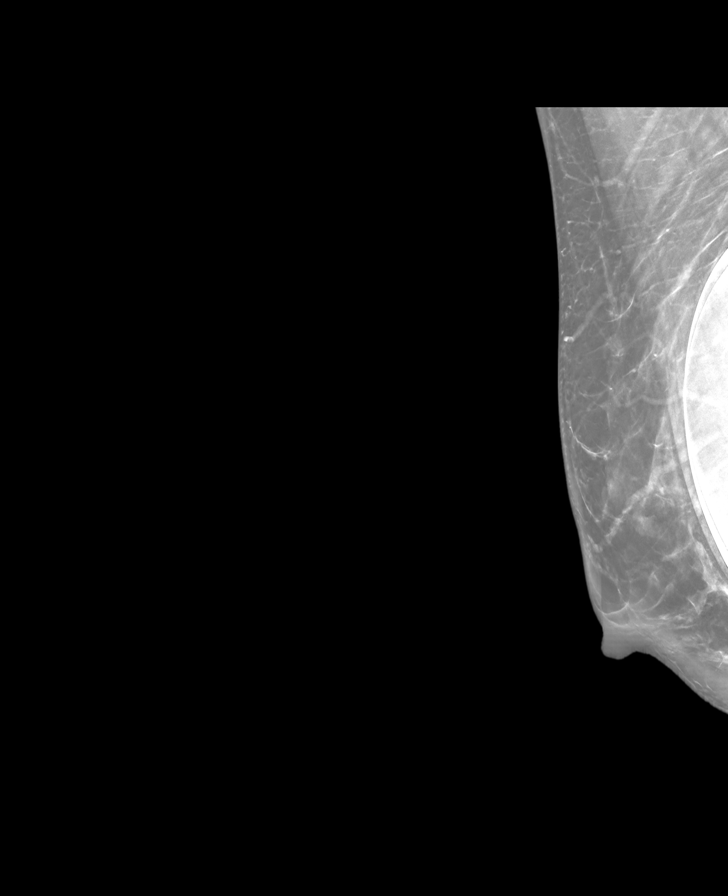

[L MLO synth-2D]
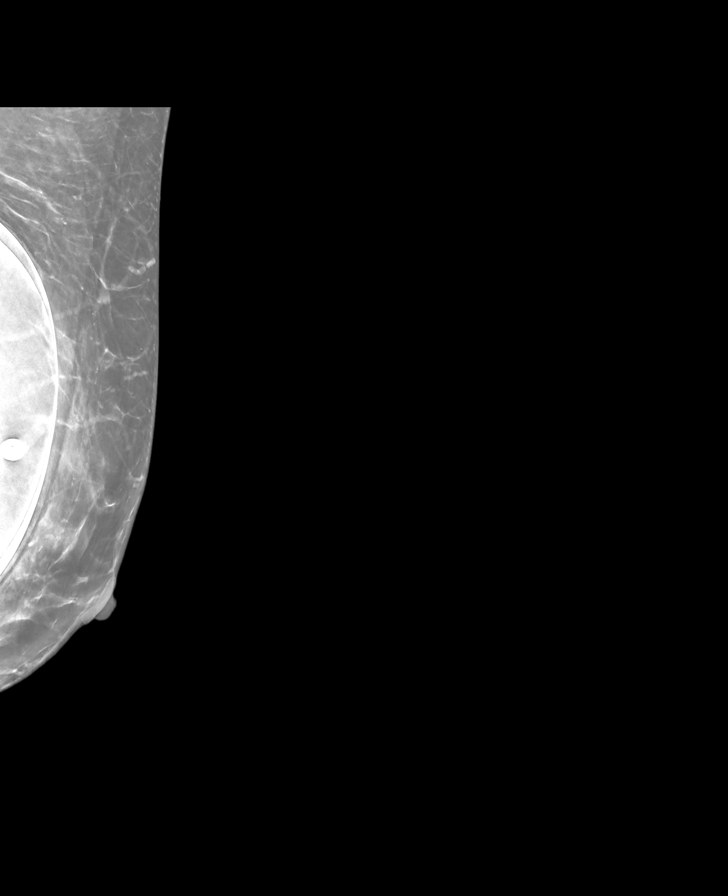

[R CC synth-2D]
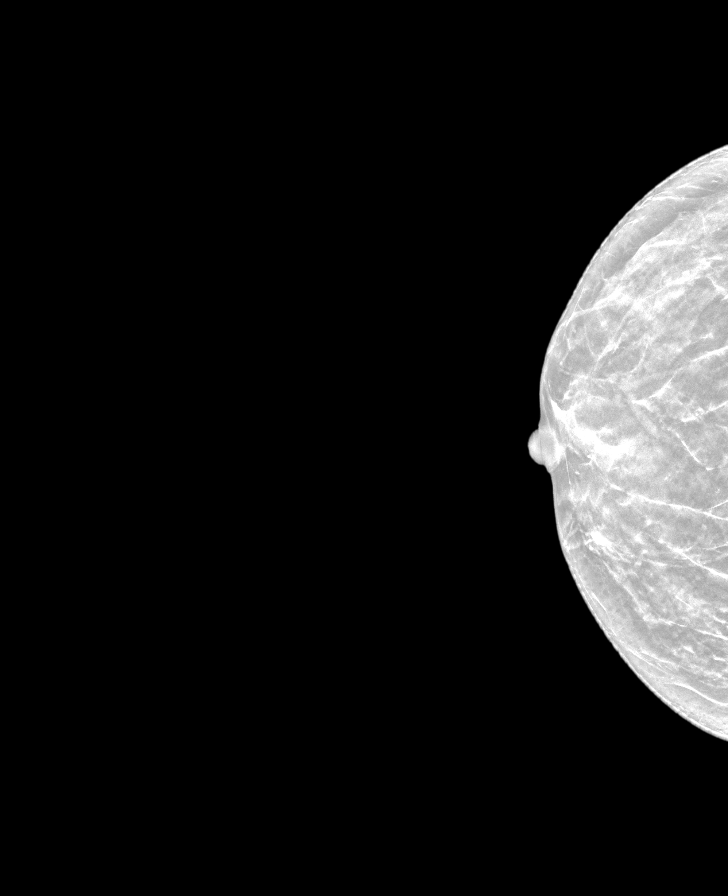

[L CC synth-2D]
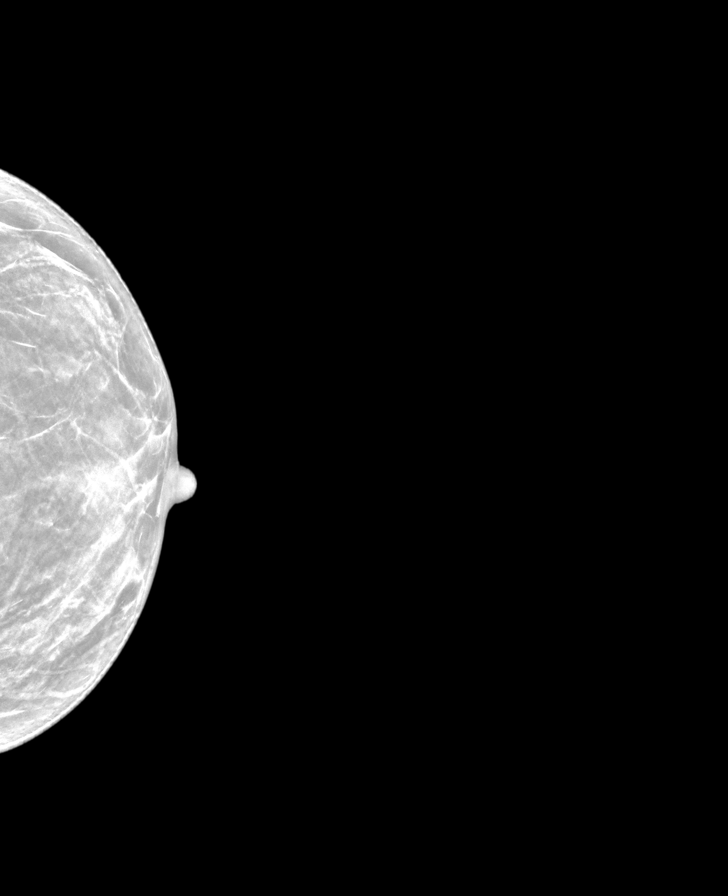

[8 of 28 positions shown; findings below may reference images not displayed]

ACR Breast Density Category b: There are scattered areas of
fibroglandular density.
FINDINGS: The patient has implants. There are no findings suspicious for
malignancy.
IMPRESSION: No mammographic evidence of malignancy. A result letter of this
screening mammogram will be mailed directly to the patient.

RECOMMENDATION:
Screening mammogram in one year. (Code:UU-[id number])

BI-RADS CATEGORY  1:  Negative.

## 2022-11-23 ENCOUNTER — Telehealth: Payer: Self-pay | Admitting: Advanced Practice Midwife

## 2022-11-23 NOTE — Telephone Encounter (Signed)
Called the patient to change appointment with Hayley Obrien on Friday November 26, 2022.  Please offer her 12/03/2022 with at 1:35pm.

## 2022-11-24 NOTE — Telephone Encounter (Signed)
The patient was rescheduled for 7/19 with Shanda Bumps A for annual exam.

## 2022-11-26 ENCOUNTER — Ambulatory Visit: Payer: Medicaid Other | Admitting: Advanced Practice Midwife

## 2022-12-03 ENCOUNTER — Ambulatory Visit: Payer: Medicaid Other | Admitting: Certified Nurse Midwife

## 2022-12-09 ENCOUNTER — Ambulatory Visit: Payer: Medicaid Other | Admitting: Obstetrics

## 2022-12-16 ENCOUNTER — Ambulatory Visit (INDEPENDENT_AMBULATORY_CARE_PROVIDER_SITE_OTHER): Payer: Medicaid Other

## 2022-12-16 ENCOUNTER — Other Ambulatory Visit (HOSPITAL_COMMUNITY)
Admission: RE | Admit: 2022-12-16 | Discharge: 2022-12-16 | Disposition: A | Discharge status: Home / Self Care | Payer: Medicaid Other | Source: Ambulatory Visit | Attending: Advanced Practice Midwife | Admitting: Advanced Practice Midwife

## 2022-12-16 VITALS — BP 106/67 | HR 71 | Ht 63.0 in | Wt 154.9 lb

## 2022-12-16 DIAGNOSIS — Z01419 Encounter for gynecological examination (general) (routine) without abnormal findings: Secondary | ICD-10-CM

## 2022-12-16 DIAGNOSIS — Z1151 Encounter for screening for human papillomavirus (HPV): Secondary | ICD-10-CM | POA: Insufficient documentation

## 2022-12-16 NOTE — Progress Notes (Signed)
Outpatient Gynecology Note: Annual Visit  Assessment/Plan:    Hayley Obrien is a 47 y.o. female [phone] with normal well-woman gynecologic exam.   Well woman exam - Reviewed health maintenance topics as documented below. - Pap smear collected today given history of abnormal pap smear in 2017. - Mammogram in March 2024, continues with annually. - TSH and CBC collected today given perimenopause vasomotor symptoms.  - Reviewed irregularities in menstrual cycle may be due to perimenopause. Given this is the first cycle that has been abnormal, will continue to monitor. Recommended coming back to clinic for discussion of hormone management if symptoms become bothersome.  - Plans to transfer care to clinic in [place] that is closer to where she lives.      Risk factors identified in Subjective to review: none Orders Placed This Encounter  Procedures   TSH   CBC   Current Outpatient Medications  Medication Instructions   metroNIDAZOLE (FLAGYL) 500 MG tablet Take 1 tab BID for 7 days; NO alcohol use for 10 days after prescription start    No follow-ups on file.    Subjective:    Hayley Obrien is a 47 y.o. female [phone] who presents for annual wellness visit.   Occupation office    Lives with son and baby girl.    CONCERNS? Last period was normal. Last month, had period for one day, then nothing for several days, followed by heavy period for about 7 days.  Experiencing perimenopause symptoms including hot flashes, dry sking, and fatigue.  Well Woman Visit:  GYN HISTORY:  Patient's last menstrual period was 11/26/2022 (approximate).     Menstrual History: OB History     Gravida  3   Para  3   Term  3   Preterm      AB      Living  3      SAB      IAB      Ectopic      Multiple  0   Live Births  57           Menarche age: 36 Patient's last menstrual period was 11/26/2022 (approximate).     Periods are regular and last 4 days. Dysmenorrhea:  none. Cyclic symptoms include: none.  Intermenstrual bleeding, spotting, or discharge? no Urinary incontinence? no  Sexually active: not in the last 3 months Number of sexual partners: one occasionally Gender of sexual Partners: male Dyspareunia? no Last pap: 2021, NILM, HPV neg History of abnormal Pap: yes Gardasil series:  no STI history: no Contraceptive methods: condoms.  Health Maintenance > Reviewed breast self-awareness > History of abnormal mammogram: No > Exercise: walking, moderately active > Dietary Supplements: multivitamin > Recent dental visit Yes.   > Seat Belt Use: Yes.   > Texting and driving? No. > Guns in the house No. > STI/HIV testing or immunizations needed? No. > Concern for alcohol abuse? none   Tobacco or other drug use: denied. Tobacco Use: Low Risk  (12/06/2022)   Received from [facility]   Patient History    Smoking Tobacco Use: Never    Smokeless Tobacco Use: Never    Passive Exposure: Never     PHQ-2 Score: In last two weeks, how often have you felt: Little interest or pleasure in doing things: Not at all (0) Feeling down, depressed or hopeless: Not at all (0) Score: 0  GAD-2 Over the last 2 weeks, how often have you been bothered by the following problems?  Feeling nervous, anxious or on edge: Not at all (0) Not being able to stop or control worrying: Not at all (0)} Score: 0  If >40:  Last mammogram:  BI-RADS-1 in March 2024 Last colonoscopy:    _________________________________________________________  Current Outpatient Medications  Medication Sig Dispense Refill   metroNIDAZOLE (FLAGYL) 500 MG tablet Take 1 tab BID for 7 days; NO alcohol use for 10 days after prescription start (Patient not taking: Reported on 12/16/2022) 14 tablet 0   No current facility-administered medications for this visit.   No Known Allergies  Past Medical History:  Diagnosis Date   Abnormal Pap smear of cervix    UTI (lower urinary tract  infection) 11-2015   resolved per pt and is to f/u with dr Jean Rosenthal prior to upcoming surgery   Past Surgical History:  Procedure Laterality Date   BREAST ENHANCEMENT SURGERY  2008   CERVICAL BIOPSY  W/ LOOP ELECTRODE EXCISION     CERVICAL CONIZATION W/BX N/A 11/25/2015   Procedure: CONIZATION CERVIX WITH BIOPSY;  Surgeon: Conard Novak, MD;  Location: [facility] OR[facility];  Service: Gynecology;  Laterality: N/A;   OB History     Gravida  3   Para  3   Term  3   Preterm      AB      Living  3      SAB      IAB      Ectopic      Multiple  0   Live Births  3          Social History   Tobacco Use   Smoking status: Never   Smokeless tobacco: Never  Substance Use Topics   Alcohol use: Yes    Comment: OCC   Social History   Substance and Sexual Activity  Sexual Activity Not Currently   Birth control/protection: None    Immunization History  Administered Date(s) Administered   Influenza,inj,Quad PF,6+ Mos 06/02/2018   Moderna Sars-Covid-2 Vaccination 11/15/2019, 12/16/2019   Tdap 06/02/2018     Review Of Systems  Constitutional: Denied constitutional symptoms, night sweats, recent illness, fatigue, fever, insomnia and weight loss.  Eyes: Denied eye symptoms, eye pain, photophobia, vision change and visual disturbance.  Ears/Nose/Throat/Neck: Denied ear, nose, throat or neck symptoms, hearing loss, nasal discharge, sinus congestion and sore throat.  Cardiovascular: Denied cardiovascular symptoms, arrhythmia, chest pain/pressure, edema, exercise intolerance, orthopnea and palpitations.  Respiratory: Denied pulmonary symptoms, asthma, pleuritic pain, productive sputum, cough, dyspnea and wheezing.  Gastrointestinal: Denied, gastro-esophageal reflux, melena, nausea and vomiting.  Genitourinary: Denied genitourinary symptoms including symptomatic vaginal discharge, pelvic relaxation issues, and urinary complaints.  Musculoskeletal: Denied musculoskeletal symptoms,  stiffness, swelling, muscle weakness and myalgia.  Dermatologic: Denied dermatology symptoms, rash and scar.  Neurologic: Denied neurology symptoms, dizziness, headache, neck pain and syncope.  Psychiatric: Denied psychiatric symptoms, anxiety and depression.  Endocrine: Positive for hot flashes, dry skin, and fatigue.       Objective:    BP 106/67   Pulse 71   Ht 5\' 3"  (1.6 m)   Wt 154 lb 14.4 oz (70.3 kg)   LMP 11/26/2022 (Approximate)   BMI 27.44 kg/m   Constitutional: Well-developed, well-nourished female in no acute distress Neurological: Alert and oriented to person, place, and time Psychiatric: Mood and affect appropriate Skin: No rashes or lesions Neck: Supple without masses. Trachea is midline.Thyroid is normal size without masses Lymphatics: No cervical, axillary, supraclavicular, or inguinal adenopathy noted Respiratory: Clear to auscultation bilaterally. Good  air movement with normal work of breathing. Cardiovascular: Regular rate and rhythm. Extremities grossly normal, nontender with no edema; pulses regular Gastrointestinal: Soft, nontender, nondistended. No masses or hernias appreciated. No hepatosplenomegaly. No fluid wave. No rebound or guarding. Breast Exam:  deferred with shared decision making Genitourinary:         External Genitalia: Normal female genitalia    Vagina: well-rugated, no lesions.    Cervix: No lesions, normal size and consistency; no cervical motion tenderness  Perineum/Anus: No lesions Rectal: deferred    Autumn Messing, CNM  12/16/22 1:42 PM

## 2022-12-16 NOTE — Assessment & Plan Note (Addendum)
-   Reviewed health maintenance topics as documented below. - Pap smear collected today given history of abnormal pap smear in 2017. - Mammogram in March 2024, continues with annually. - TSH and CBC collected today given perimenopause vasomotor symptoms.  - Reviewed irregularities in menstrual cycle may be due to perimenopause. Given this is the first cycle that has been abnormal, will continue to monitor. Recommended coming back to clinic for discussion of hormone management if symptoms become bothersome.  - Plans to transfer care to clinic in [place] that is closer to where she lives.

## 2023-12-14 ENCOUNTER — Ambulatory Visit (INDEPENDENT_AMBULATORY_CARE_PROVIDER_SITE_OTHER): Admitting: Certified Nurse Midwife

## 2023-12-14 ENCOUNTER — Encounter: Payer: Self-pay | Admitting: Certified Nurse Midwife

## 2023-12-14 VITALS — BP 116/74 | HR 75 | Ht 63.0 in | Wt 164.0 lb

## 2023-12-14 DIAGNOSIS — M5441 Lumbago with sciatica, right side: Secondary | ICD-10-CM | POA: Diagnosis not present

## 2023-12-14 DIAGNOSIS — M5442 Lumbago with sciatica, left side: Secondary | ICD-10-CM

## 2023-12-14 DIAGNOSIS — Z1231 Encounter for screening mammogram for malignant neoplasm of breast: Secondary | ICD-10-CM

## 2023-12-14 DIAGNOSIS — Z01419 Encounter for gynecological examination (general) (routine) without abnormal findings: Secondary | ICD-10-CM | POA: Diagnosis not present

## 2023-12-14 MED ORDER — VENLAFAXINE HCL ER 37.5 MG PO CP24: 37.5000 mg | ORAL_CAPSULE | Freq: Every day | ORAL | 3 refills | Status: AC

## 2023-12-14 NOTE — Patient Instructions (Signed)
 Preventive Care 16-47 Years Old, Female  Preventive care refers to lifestyle choices and visits with your health care provider that can promote health and wellness. Preventive care visits are also called wellness exams.  What can I expect for my preventive care visit?  Counseling  Your health care provider may ask you questions about your:  Medical history, including:  Past medical problems.  Family medical history.  Pregnancy history.  Current health, including:  Menstrual cycle.  Method of birth control.  Emotional well-being.  Home life and relationship well-being.  Sexual activity and sexual health.  Lifestyle, including:  Alcohol, nicotine or tobacco, and drug use.  Access to firearms.  Diet, exercise, and sleep habits.  Work and work Astronomer.  Sunscreen use.  Safety issues such as seatbelt and bike helmet use.  Physical exam  Your health care provider will check your:  Height and weight. These may be used to calculate your BMI (body mass index). BMI is a measurement that tells if you are at a healthy weight.  Waist circumference. This measures the distance around your waistline. This measurement also tells if you are at a healthy weight and may help predict your risk of certain diseases, such as type 2 diabetes and high blood pressure.  Heart rate and blood pressure.  Body temperature.  Skin for abnormal spots.  What immunizations do I need?    Vaccines are usually given at various ages, according to a schedule. Your health care provider will recommend vaccines for you based on your age, medical history, and lifestyle or other factors, such as travel or where you work.  What tests do I need?  Screening  Your health care provider may recommend screening tests for certain conditions. This may include:  Lipid and cholesterol levels.  Diabetes screening. This is done by checking your blood sugar (glucose) after you have not eaten for a while (fasting).  Pelvic exam and Pap test.  Hepatitis B test.  Hepatitis C  test.  HIV (human immunodeficiency virus) test.  STI (sexually transmitted infection) testing, if you are at risk.  Lung cancer screening.  Colorectal cancer screening.  Mammogram. Talk with your health care provider about when you should start having regular mammograms. This may depend on whether you have a family history of breast cancer.  BRCA-related cancer screening. This may be done if you have a family history of breast, ovarian, tubal, or peritoneal cancers.  Bone density scan. This is done to screen for osteoporosis.  Talk with your health care provider about your test results, treatment options, and if necessary, the need for more tests.  Follow these instructions at home:  Eating and drinking    Eat a diet that includes fresh fruits and vegetables, whole grains, lean protein, and low-fat dairy products.  Take vitamin and mineral supplements as recommended by your health care provider.  Do not drink alcohol if:  Your health care provider tells you not to drink.  You are pregnant, may be pregnant, or are planning to become pregnant.  If you drink alcohol:  Limit how much you have to 0-1 drink a day.  Know how much alcohol is in your drink. In the [place] one drink equals one 12 oz bottle of beer (355 mL), one 5 oz glass of wine (148 mL), or one 1 oz glass of hard liquor (44 mL).  Lifestyle  Brush your teeth every morning and night with fluoride toothpaste. Floss one time each day.  Exercise for at least  30 minutes 5 or more days each week.  Do not use any products that contain nicotine or tobacco. These products include cigarettes, chewing tobacco, and vaping devices, such as e-cigarettes. If you need help quitting, ask your health care provider.  Do not use drugs.  If you are sexually active, practice safe sex. Use a condom or other form of protection to prevent STIs.  If you do not wish to become pregnant, use a form of birth control. If you plan to become pregnant, see your health care provider for a  prepregnancy visit.  Take aspirin only as told by your health care provider. Make sure that you understand how much to take and what form to take. Work with your health care provider to find out whether it is safe and beneficial for you to take aspirin daily.  Find healthy ways to manage stress, such as:  Meditation, yoga, or listening to music.  Journaling.  Talking to a trusted person.  Spending time with friends and family.  Minimize exposure to UV radiation to reduce your risk of skin cancer.  Safety  Always wear your seat belt while driving or riding in a vehicle.  Do not drive:  If you have been drinking alcohol. Do not ride with someone who has been drinking.  When you are tired or distracted.  While texting.  If you have been using any mind-altering substances or drugs.  Wear a helmet and other protective equipment during sports activities.  If you have firearms in your house, make sure you follow all gun safety procedures.  Seek help if you have been physically or sexually abused.  What's next?  Visit your health care provider once a year for an annual wellness visit.  Ask your health care provider how often you should have your eyes and teeth checked.  Stay up to date on all vaccines.  This information is not intended to replace advice given to you by your health care provider. Make sure you discuss any questions you have with your health care provider.  Document Revised: 10/29/2020 Document Reviewed: 10/29/2020  Elsevier Patient Education  2024 [employer].

## 2023-12-14 NOTE — Progress Notes (Signed)
 GYNECOLOGY ANNUAL PREVENTATIVE CARE ENCOUNTER NOTE  History:     Hayley Obrien is a 48 y.o. G30P3003 female here for a routine annual gynecologic exam.  Current complaints: lower back pain x 6 months, hot flashes 3 x week. States happen during the day and night. She notes skin changes, weight gain , and headaches.   Denies abnormal vaginal bleeding, discharge, pelvic pain, problems with intercourse or other gynecologic concerns.     Social Relationship:single Living: with her 32 yr old daughter Work:  in office Exercise:occasional  Smoke/Alcohol/drug use: rare alcohol use- wine  Gynecologic History Patient's last menstrual period was 11/14/2023 (approximate). Contraception: condoms Last Pap: 12/16/2022. Results were: normal with negative HPV Last mammogram: 11/2023. Results were: per pt norma   Obstetric History OB History  Gravida Para Term Preterm AB Living  3 3 3   3   SAB IAB Ectopic Multiple Live Births     0 3    # Outcome Date GA Lbr Len/2nd Weight Sex Type Anes PTL Lv  3 Term 07/26/18 [redacted]w[redacted]d / 02:45 7 lb 7.9 oz (3.4 kg) F Vag-Spont Local  LIV  2 Term           1 Term             Past Medical History:  Diagnosis Date   Abnormal Pap smear of cervix    UTI (lower urinary tract infection) 11-2015   resolved per pt and is to f/u with dr leonce prior to upcoming surgery    Past Surgical History:  Procedure Laterality Date   BREAST ENHANCEMENT SURGERY  2008   CERVICAL BIOPSY  W/ LOOP ELECTRODE EXCISION     CERVICAL CONIZATION W/BX N/A 11/25/2015   Procedure: CONIZATION CERVIX WITH BIOPSY;  Surgeon: Garnette JONETTA leonce, MD;  Location: [facility] ORS;  Service: Gynecology;  Laterality: N/A;    Current Outpatient Medications on File Prior to Visit  Medication Sig Dispense Refill   metroNIDAZOLE  (FLAGYL ) 500 MG tablet Take 1 tab BID for 7 days; NO alcohol use for 10 days after prescription start (Patient not taking: Reported on 12/14/2023) 14 tablet 0   No current  facility-administered medications on file prior to visit.    No Known Allergies  Social History:  reports that she has never smoked. She has never used smokeless tobacco. She reports current alcohol use. She reports that she does not use drugs.  Family History  Problem Relation Age of Onset   Uterine cancer Sister 20       cured   Colon cancer Maternal Grandmother    Breast cancer Neg Hx     The following portions of the patient's history were reviewed and updated as appropriate: allergies, current medications, past family history, past medical history, past social history, past surgical history and problem list.  Review of Systems Pertinent items noted in HPI and remainder of comprehensive ROS otherwise negative.  Physical Exam:  BP 116/74   Pulse 75   Ht 5' 3 (1.6 m)   Wt 164 lb (74.4 kg)   LMP 11/14/2023 (Approximate)   BMI 29.05 kg/m  CONSTITUTIONAL: Well-developed, well-nourished female in no acute distress.  HENT:  Normocephalic, atraumatic, External right and left ear normal. Oropharynx is clear and moist EYES: Conjunctivae and EOM are normal. Pupils are equal, round, and reactive to light. No scleral icterus.  NECK: Normal range of motion, supple, no masses.  Normal thyroid.  SKIN: Skin is warm and dry. No rash  noted. Not diaphoretic. No erythema. No pallor. MUSCULOSKELETAL: Normal range of motion. No tenderness.  No cyanosis, clubbing, or edema.  2+ distal pulses. NEUROLOGIC: Alert and oriented to person, place, and time. Normal reflexes, muscle tone coordination.  PSYCHIATRIC: Normal mood and affect. Normal behavior. Normal judgment and thought content. CARDIOVASCULAR: Normal heart rate noted, regular rhythm RESPIRATORY: Clear to auscultation bilaterally. Effort and breath sounds normal, no problems with respiration noted. BREASTS: Symmetric in size. No masses, tenderness, skin changes, nipple drainage, or lymphadenopathy bilaterally.  ABDOMEN: Soft, no distention  noted.  No tenderness, rebound or guarding.  PELVIC: Normal appearing external genitalia and urethral meatus; normal appearing vaginal mucosa and cervix.  No abnormal discharge noted.  Pap smear not due.  Normal uterine size, no other palpable masses, no uterine or adnexal tenderness.  .   Assessment and Plan:    1. Women's annual routine gynecological examination (Primary)   2. Screening mammogram for breast cancer    Pap: not due  Mammogram : ordered Labs: none Refills: Effexor  for hot flashes Referral: chiropractor  Routine preventative health maintenance measures emphasized. Please refer to After Visit Summary for other counseling recommendations.      Hayley Obrien, CNM [facility]  [facility],  [facility]

## 2024-01-06 ENCOUNTER — Other Ambulatory Visit: Payer: Self-pay | Admitting: Certified Nurse Midwife

## 2024-03-28 ENCOUNTER — Ambulatory Visit: Admitting: Registered Nurse

## 2024-03-28 ENCOUNTER — Encounter: Payer: Self-pay | Admitting: Registered Nurse

## 2024-03-28 VITALS — BP 127/76 | HR 70 | Ht 63.0 in | Wt 162.0 lb

## 2024-03-28 DIAGNOSIS — N939 Abnormal uterine and vaginal bleeding, unspecified: Secondary | ICD-10-CM

## 2024-03-28 DIAGNOSIS — N920 Excessive and frequent menstruation with regular cycle: Secondary | ICD-10-CM | POA: Diagnosis not present

## 2024-03-28 NOTE — Progress Notes (Signed)
    GYNECOLOGY PROGRESS NOTE  Subjective:  PCP: Hayley Obrien, No Pcp Per  Hayley Obrien ID: Hayley Obrien, female    DOB: June 14, 1975, 48 y.o.   MRN: [id number]  HPI  Hayley Obrien is a 48 y.o. G1P3003 female who presents for Mennorrhagia. Periods have been heavy her whole life.  She did not have a period in July or August 2025. When she had a period in September, she was changing her pad every and had significant cramping. Her period in October was heavier than usual. Her November period was more normal for her, but she had cramping and premenstrual sx x2 weeks prior to the start of her period.  Also reports fatigue, having no energy ,headaches and feeling dizzy for the past month. She did not take the effexor  that was prescribed for her hot flashes. She continues to have those, and they often wake her at night.   Objective:   Blood pressure 127/76, pulse 70, height 5' 3 (1.6 m), weight 162 lb (73.5 kg), last menstrual period 03/25/2024. Body mass index is 28.7 kg/m.  General appearance: alert, appears stated age, and no distress Had annual GYN visit with Zelda Hummer 12/14/23.  Assessment/Plan:  Menorrhagia Reviewed perimenopausal timeframe. Common sx are hot flashes, sleep disturbances, fatigue, brain fog, skipped or irregular periods, heavy periods.  Offered lab evaluation of CBC, Ferritin, TSH, FSH and estradiol . Pt accepted. Was previously prescribed Effexor  for hot flashes. She opted not to take the medication.  We briefly discussed HRT for symptomatic tx of perimenopausal sx vs tx menorrhagia with NSAIDs and TXA. She is undecided and would like to discuss further when labs result. She says it's okay to send her a Hayley Obrien portal message.   Lauraine Lakes CNM [facility]

## 2024-03-28 NOTE — Assessment & Plan Note (Signed)
 Reviewed perimenopausal timeframe. Common sx are hot flashes, sleep disturbances, fatigue, brain fog, skipped or irregular periods, heavy periods.  Offered lab evaluation of CBC, Ferritin, TSH, FSH and estradiol . Pt accepted. Was previously prescribed Effexor  for hot flashes. She opted not to take the medication.  We briefly discussed HRT for symptomatic tx of perimenopausal sx vs tx menorrhagia with NSAIDs and TXA. She is undecided and would like to discuss further when labs result. She says it's okay to send her a patient portal message.

## 2024-03-29 ENCOUNTER — Ambulatory Visit: Payer: Self-pay | Admitting: Registered Nurse

## 2024-03-29 LAB — CBC
Hematocrit: 42 % (ref 34.0–46.6)
Hemoglobin: 13.5 g/dL (ref 11.1–15.9)
MCH: 27.7 pg (ref 26.6–33.0)
MCHC: 32.1 g/dL (ref 31.5–35.7)
MCV: 86 fL (ref 79–97)
Platelets: 435 x10E3/uL (ref 150–450)
RBC: 4.87 x10E6/uL (ref 3.77–5.28)
RDW: 13.1 % (ref 11.7–15.4)
WBC: 7.3 x10E3/uL (ref 3.4–10.8)

## 2024-03-29 LAB — ESTRADIOL: Estradiol: 26.5 pg/mL

## 2024-03-29 LAB — FERRITIN: Ferritin: 28 ng/mL (ref 15–150)

## 2024-03-29 LAB — TSH: TSH: 1.44 u[IU]/mL (ref 0.450–4.500)

## 2024-03-29 LAB — FOLLICLE STIMULATING HORMONE: FSH: 21.5 m[IU]/mL
